# Patient Record
Sex: Female | Born: 1955
Health system: Southern US, Community
[De-identification: ages and names within clinical notes are randomized; demographics above are authoritative.]

## PROBLEM LIST (undated history)

## (undated) DIAGNOSIS — S86012A Strain of left Achilles tendon, initial encounter: Secondary | ICD-10-CM

## (undated) DIAGNOSIS — Z87442 Personal history of urinary calculi: Secondary | ICD-10-CM

## (undated) HISTORY — PX: COLONOSCOPY: SHX174

---

## 1998-05-11 ENCOUNTER — Other Ambulatory Visit: Admission: RE | Admit: 1998-05-11 | Discharge: 1998-05-11 | Payer: Self-pay | Admitting: Obstetrics and Gynecology

## 1999-09-21 ENCOUNTER — Other Ambulatory Visit: Admission: RE | Admit: 1999-09-21 | Discharge: 1999-09-21 | Payer: Self-pay | Admitting: Obstetrics and Gynecology

## 1999-09-21 ENCOUNTER — Encounter (INDEPENDENT_AMBULATORY_CARE_PROVIDER_SITE_OTHER): Payer: Self-pay

## 1999-11-28 ENCOUNTER — Other Ambulatory Visit: Admission: RE | Admit: 1999-11-28 | Discharge: 1999-11-28 | Payer: Self-pay | Admitting: Obstetrics and Gynecology

## 1999-11-28 ENCOUNTER — Encounter (INDEPENDENT_AMBULATORY_CARE_PROVIDER_SITE_OTHER): Payer: Self-pay

## 1999-12-06 ENCOUNTER — Other Ambulatory Visit: Admission: RE | Admit: 1999-12-06 | Discharge: 1999-12-06 | Payer: Self-pay | Admitting: Obstetrics and Gynecology

## 2001-02-16 ENCOUNTER — Other Ambulatory Visit: Admission: RE | Admit: 2001-02-16 | Discharge: 2001-02-16 | Payer: Self-pay | Admitting: Obstetrics and Gynecology

## 2002-11-15 ENCOUNTER — Other Ambulatory Visit: Admission: RE | Admit: 2002-11-15 | Discharge: 2002-11-15 | Payer: Self-pay | Admitting: Obstetrics and Gynecology

## 2004-06-05 ENCOUNTER — Other Ambulatory Visit: Admission: RE | Admit: 2004-06-05 | Discharge: 2004-06-05 | Payer: Self-pay | Admitting: Obstetrics and Gynecology

## 2005-08-19 ENCOUNTER — Other Ambulatory Visit: Admission: RE | Admit: 2005-08-19 | Discharge: 2005-08-19 | Payer: Self-pay | Admitting: *Deleted

## 2006-11-03 ENCOUNTER — Other Ambulatory Visit: Admission: RE | Admit: 2006-11-03 | Discharge: 2006-11-03 | Payer: Self-pay | Admitting: *Deleted

## 2007-12-16 ENCOUNTER — Other Ambulatory Visit: Admission: RE | Admit: 2007-12-16 | Discharge: 2007-12-16 | Payer: Self-pay | Admitting: Gynecology

## 2018-04-23 DIAGNOSIS — Z1231 Encounter for screening mammogram for malignant neoplasm of breast: Secondary | ICD-10-CM | POA: Diagnosis not present

## 2018-04-28 DIAGNOSIS — R922 Inconclusive mammogram: Secondary | ICD-10-CM | POA: Diagnosis not present

## 2018-05-20 DIAGNOSIS — H6123 Impacted cerumen, bilateral: Secondary | ICD-10-CM | POA: Diagnosis not present

## 2018-05-20 DIAGNOSIS — E7800 Pure hypercholesterolemia, unspecified: Secondary | ICD-10-CM | POA: Diagnosis not present

## 2018-05-20 DIAGNOSIS — Z23 Encounter for immunization: Secondary | ICD-10-CM | POA: Diagnosis not present

## 2018-05-20 DIAGNOSIS — Z0000 Encounter for general adult medical examination without abnormal findings: Secondary | ICD-10-CM | POA: Diagnosis not present

## 2018-05-20 DIAGNOSIS — E039 Hypothyroidism, unspecified: Secondary | ICD-10-CM | POA: Diagnosis not present

## 2018-06-15 DIAGNOSIS — D125 Benign neoplasm of sigmoid colon: Secondary | ICD-10-CM | POA: Diagnosis not present

## 2018-06-15 DIAGNOSIS — D120 Benign neoplasm of cecum: Secondary | ICD-10-CM | POA: Diagnosis not present

## 2018-06-15 DIAGNOSIS — K5730 Diverticulosis of large intestine without perforation or abscess without bleeding: Secondary | ICD-10-CM | POA: Diagnosis not present

## 2018-06-15 DIAGNOSIS — Z1211 Encounter for screening for malignant neoplasm of colon: Secondary | ICD-10-CM | POA: Diagnosis not present

## 2018-06-15 DIAGNOSIS — K635 Polyp of colon: Secondary | ICD-10-CM | POA: Diagnosis not present

## 2018-09-03 DIAGNOSIS — J101 Influenza due to other identified influenza virus with other respiratory manifestations: Secondary | ICD-10-CM | POA: Diagnosis not present

## 2019-10-18 ENCOUNTER — Ambulatory Visit: Payer: Self-pay

## 2020-02-09 ENCOUNTER — Emergency Department (HOSPITAL_BASED_OUTPATIENT_CLINIC_OR_DEPARTMENT_OTHER)
Admission: EM | Admit: 2020-02-09 | Discharge: 2020-02-09 | Disposition: A | Discharge status: Home / Self Care | Payer: Commercial Managed Care - PPO | Attending: Emergency Medicine | Admitting: Emergency Medicine

## 2020-02-09 ENCOUNTER — Other Ambulatory Visit: Payer: Self-pay

## 2020-02-09 ENCOUNTER — Emergency Department (HOSPITAL_BASED_OUTPATIENT_CLINIC_OR_DEPARTMENT_OTHER): Payer: Commercial Managed Care - PPO

## 2020-02-09 ENCOUNTER — Encounter (HOSPITAL_BASED_OUTPATIENT_CLINIC_OR_DEPARTMENT_OTHER): Payer: Self-pay | Admitting: Emergency Medicine

## 2020-02-09 DIAGNOSIS — N201 Calculus of ureter: Secondary | ICD-10-CM | POA: Diagnosis not present

## 2020-02-09 DIAGNOSIS — R350 Frequency of micturition: Secondary | ICD-10-CM | POA: Diagnosis not present

## 2020-02-09 DIAGNOSIS — R112 Nausea with vomiting, unspecified: Secondary | ICD-10-CM | POA: Insufficient documentation

## 2020-02-09 DIAGNOSIS — R109 Unspecified abdominal pain: Secondary | ICD-10-CM | POA: Diagnosis present

## 2020-02-09 LAB — CBC WITH DIFFERENTIAL/PLATELET
Abs Immature Granulocytes: 0.03 10*3/uL (ref 0.00–0.07)
Basophils Absolute: 0.1 10*3/uL (ref 0.0–0.1)
Basophils Relative: 1 %
Eosinophils Absolute: 0 10*3/uL (ref 0.0–0.5)
Eosinophils Relative: 0 %
HCT: 44.7 % (ref 36.0–46.0)
Hemoglobin: 14.9 g/dL (ref 12.0–15.0)
Immature Granulocytes: 0 %
Lymphocytes Relative: 15 %
Lymphs Abs: 1.4 10*3/uL (ref 0.7–4.0)
MCH: 30.3 pg (ref 26.0–34.0)
MCHC: 33.3 g/dL (ref 30.0–36.0)
MCV: 90.9 fL (ref 80.0–100.0)
Monocytes Absolute: 0.7 10*3/uL (ref 0.1–1.0)
Monocytes Relative: 7 %
Neutro Abs: 6.8 10*3/uL (ref 1.7–7.7)
Neutrophils Relative %: 77 %
Platelets: 249 10*3/uL (ref 150–400)
RBC: 4.92 MIL/uL (ref 3.87–5.11)
RDW: 11.9 % (ref 11.5–15.5)
WBC: 8.9 10*3/uL (ref 4.0–10.5)
nRBC: 0 % (ref 0.0–0.2)

## 2020-02-09 LAB — BASIC METABOLIC PANEL
Anion gap: 11 (ref 5–15)
BUN: 9 mg/dL (ref 8–23)
CO2: 27 mmol/L (ref 22–32)
Calcium: 9.7 mg/dL (ref 8.9–10.3)
Chloride: 101 mmol/L (ref 98–111)
Creatinine, Ser: 0.66 mg/dL (ref 0.44–1.00)
GFR calc Af Amer: >60 mL/min (ref >60)
GFR calc non Af Amer: >60 mL/min (ref >60)
Glucose, Bld: 114 mg/dL — ABNORMAL HIGH (ref 70–99)
Potassium: 4.3 mmol/L (ref 3.5–5.1)
Sodium: 139 mmol/L (ref 135–145)

## 2020-02-09 LAB — URINALYSIS, ROUTINE W REFLEX MICROSCOPIC
Bilirubin Urine: NEGATIVE
Glucose, UA: NEGATIVE mg/dL
Ketones, ur: NEGATIVE mg/dL
Nitrite: NEGATIVE
Protein, ur: NEGATIVE mg/dL
Specific Gravity, Urine: 1.01 (ref 1.005–1.030)
pH: 6.5 (ref 5.0–8.0)

## 2020-02-09 LAB — URINALYSIS, MICROSCOPIC (REFLEX)

## 2020-02-09 MED ORDER — TAMSULOSIN HCL 0.4 MG PO CAPS: 0.4000 mg | ORAL_CAPSULE | Freq: Every day | ORAL | 0 refills | Status: DC

## 2020-02-09 MED ORDER — KETOROLAC TROMETHAMINE 30 MG/ML IJ SOLN
30.0000 mg | Freq: Once | INTRAMUSCULAR | Status: AC
  Administered 2020-02-09: 30 mg via INTRAMUSCULAR
  Filled 2020-02-09: qty 1

## 2020-02-09 MED ORDER — ONDANSETRON 4 MG PO TBDP
4.0000 mg | ORAL_TABLET | Freq: Three times a day (TID) | ORAL | 0 refills | Status: DC | PRN
Start: 2020-02-09 — End: 2023-11-28

## 2020-02-09 MED ORDER — OXYCODONE-ACETAMINOPHEN 5-325 MG PO TABS: 1.0000 | ORAL_TABLET | Freq: Four times a day (QID) | ORAL | 0 refills | Status: DC | PRN

## 2020-02-09 NOTE — Discharge Instructions (Signed)
Please read instructions below. Drink plenty of water. You can take ibuprofen for mild to moderate pain. You can take Percocet every 6 hours as needed for severe pain. You can take Zofran every 6 hours as needed for nausea. Take flomax once per day for bladder spasm. Follow up with Urology, as it is likely you may need help with passing the stone. Return to the ER for fever, chills, uncontrollable vomiting, uncontrollable pain or worsening symptoms.

## 2020-02-09 NOTE — ED Notes (Signed)
Patient transported to CT 

## 2020-02-09 NOTE — ED Notes (Signed)
Pt with LLQ cramping, shooting pain that began this am that radiates to left flank. No hx of stones previously. Has noticed change in urine color, consistency. Denies discharge or bleeding.

## 2020-02-09 NOTE — ED Triage Notes (Signed)
LLQ abdominal pain since this am.  Pt has some dysuria.

## 2020-02-09 NOTE — ED Provider Notes (Signed)
[facility] EMERGENCY DEPARTMENT Provider Note   CSN: [id number] Arrival date & time: 02/09/20  1205     History Chief Complaint  Patient presents with  . Flank Pain  . Abdominal Pain    Suzanne Collins is a 64 y.o. female presenting to the emergency department with complaint of left lower quadrant abdominal pain that radiates to her left flank that began today.  She reports associated dark urine and some increased urinary frequency this evening.  She also reports nausea with an episode of nonbloody nonbilious emesis.  She denies dysuria, fevers, chills, diarrhea, pelvic complaints.   The history is provided by the patient.       No past medical history on file.  There are no problems to display for this patient.   History reviewed. No pertinent surgical history.   OB History   No obstetric history on file.     No family history on file.  Social History   Tobacco Use  . Smoking status: Never Smoker  . Smokeless tobacco: Never Used  Substance Use Topics  . Alcohol use: Not on file  . Drug use: Not on file    Home Medications Prior to Admission medications   Medication Sig Start Date End Date Taking? Authorizing Provider  ondansetron (ZOFRAN ODT) 4 MG disintegrating tablet Take 1 tablet (4 mg total) by mouth every 8 (eight) hours as needed for nausea or vomiting. 02/09/20   Kihanna Kamiya, [place] N, PA-C  oxyCODONE-acetaminophen (PERCOCET/ROXICET) 5-325 MG tablet Take 1 tablet by mouth every 6 (six) hours as needed for severe pain. 02/09/20   Josephus Harriger, [place] N, PA-C  tamsulosin (FLOMAX) 0.4 MG CAPS capsule Take 1 capsule (0.4 mg total) by mouth daily. 02/09/20   Jazper Nikolai, [place] N, PA-C    Allergies    Patient has no known allergies.  Review of Systems   Review of Systems  Gastrointestinal: Positive for abdominal pain, nausea and vomiting.  Genitourinary: Positive for flank pain and frequency.  All other systems reviewed and are negative.   Physical  Exam Updated Vital Signs BP (!) 161/110 (BP Location: Right Arm)   Pulse (!) 104   Temp 98.2 F (36.8 C) (Oral)   Resp 18   Ht 5\' 4"  (1.626 m)   Wt 83 kg   SpO2 99%   BMI 31.41 kg/m   Physical Exam Vitals and nursing note reviewed.  Constitutional:      Appearance: She is well-developed.     Comments: Patient is very well-appearing, no distress  HENT:     Head: Normocephalic and atraumatic.  Eyes:     Conjunctiva/sclera: Conjunctivae normal.  Cardiovascular:     Rate and Rhythm: Normal rate and regular rhythm.  Pulmonary:     Effort: Pulmonary effort is normal.     Breath sounds: Normal breath sounds.  Abdominal:     General: Bowel sounds are normal.     Palpations: Abdomen is soft.     Tenderness: There is abdominal tenderness in the left lower quadrant. There is left CVA tenderness. There is no guarding or rebound.  Skin:    General: Skin is warm.  Neurological:     Mental Status: She is alert.  Psychiatric:        Behavior: Behavior normal.     ED Results / Procedures / Treatments   Labs (all labs ordered are listed, but only abnormal results are displayed) Labs Reviewed  URINALYSIS, ROUTINE W REFLEX MICROSCOPIC - Abnormal; Notable for the following components:  Result Value   APPearance CLOUDY (*)    Hgb urine dipstick LARGE (*)    Leukocytes,Ua TRACE (*)    All other components within normal limits  URINALYSIS, MICROSCOPIC (REFLEX) - Abnormal; Notable for the following components:   Bacteria, UA FEW (*)    All other components within normal limits  BASIC METABOLIC PANEL - Abnormal; Notable for the following components:   Glucose, Bld 114 (*)    All other components within normal limits  CBC WITH DIFFERENTIAL/PLATELET    EKG None  Radiology CT Renal Stone Study  Result Date: 02/09/2020 CLINICAL DATA:  64 year old female with flank pain. Concern for kidney stone. EXAM: CT ABDOMEN AND PELVIS WITHOUT CONTRAST TECHNIQUE: Multidetector CT imaging of  the abdomen and pelvis was performed following the standard protocol without IV contrast. COMPARISON:  None. FINDINGS: Evaluation of this exam is limited in the absence of intravenous contrast. Lower chest: The visualized lung bases are clear. No intra-abdominal free air or free fluid. Hepatobiliary: Multiple hepatic hypodense lesions are suboptimally characterized on this noncontrast CT and measure up to approximately 3 cm in the dome of the liver. The larger lesions demonstrate fluid attenuation consistent with cysts. There is a 3 cm hypodense lesion in the posterior right lobe of the liver (19/2) with a focal area of peripheral calcification. This lesion demonstrates a slightly higher attenuation than simple fluid. Further characterization with MRI without and with contrast on a nonemergent/outpatient basis recommended. There is no intrahepatic biliary ductal dilatation. The gallbladder is unremarkable. Pancreas: Unremarkable. No pancreatic ductal dilatation or surrounding inflammatory changes. Spleen: Normal in size without focal abnormality. Adrenals/Urinary Tract: The adrenal glands unremarkable. There is a 1 cm stone in the proximal left ureter at the ureteropelvic junction with mild left hydronephrosis. The right kidney, right ureter, and urinary bladder appear unremarkable. Stomach/Bowel: There is sigmoid diverticulosis without active inflammatory changes. There is no bowel obstruction or active inflammation. The appendix is normal. Vascular/Lymphatic: The abdominal aorta and IVC unremarkable. No portal venous gas. There is no adenopathy. Reproductive: The uterus and ovaries are grossly unremarkable. No pelvic mass Other: None Musculoskeletal: Degenerative changes of spine. No acute osseous pathology. IMPRESSION: 1. A 1 cm proximal left ureteral stone with mild left hydronephrosis. 2. Sigmoid diverticulosis. No bowel obstruction. Normal appendix. 3. Indeterminate hepatic lesions. Further characterization  with MRI without and with contrast on a nonemergent/outpatient basis recommended. Electronically Signed   By: Anner Crete M.D.   On: 02/09/2020 15:23    Procedures Procedures (including critical care time)  Medications Ordered in ED Medications  ketorolac (TORADOL) 30 MG/ML injection 30 mg (has no administration in time range)    ED Course  I have reviewed the triage vital signs and the nursing notes.  Pertinent labs & imaging results that were available during my care of the patient were reviewed by me and considered in my medical decision making (see chart for details).  Clinical Course as of Feb 08 1841  Wed Feb 09, 2020  1734 LLQ pain radiating to back, no dysuria. Nausea and vomiting. Started today.   [JR]    Clinical Course User Index [JR] Lonna Rabold, [place] N, PA-C   MDM Rules/Calculators/A&P                          Patient presenting with left lower quadrant abdominal pain, left flank pain, nausea and vomiting urinary frequency that began today.  CT scan and UA obtained in triage reveal a  1 cm proximal left ureteral stone with mild hydronephrosis.  UA with hematuria, no infection.  On exam, she is very well-appearing and in no distress, tenderness to the left abdomen and flank.  She states symptoms significantly improved without intervention.  Metabolic panel obtained to evaluate for kidney function, and is normal.  Will treat with Toradol here, and prescribed medications should symptoms return.  Discussed likelihood of needing additional assistance from urology for passing stone due to size.  Also discussed incidental findings of liver lesions on CT scan and need for nonemergent outpatient MRI.  She is to follow-up with PCP for this incidental finding, patient verbalized understanding.  She is instructed of close follow-up and strict return precautions.  Urology notified of patient's referral and need for follow-up.  [place] Controlled Substance reporting System  queried  Discussed results, findings, treatment and follow up. Patient advised of return precautions. Patient verbalized understanding and agreed with plan.  Final Clinical Impression(s) / ED Diagnoses Final diagnoses:  Left ureteral stone    Rx / DC Orders ED Discharge Orders         Ordered    oxyCODONE-acetaminophen (PERCOCET/ROXICET) 5-325 MG tablet  Every 6 hours PRN     Discontinue  Reprint     02/09/20 1837    tamsulosin (FLOMAX) 0.4 MG CAPS capsule  Daily     Discontinue  Reprint     02/09/20 1837    ondansetron (ZOFRAN ODT) 4 MG disintegrating tablet  Every 8 hours PRN     Discontinue  Reprint     02/09/20 1837           Alanah Sakuma, [place] N, PA-C 02/09/20 1847    Virgel Manifold, MD 02/11/20 2307

## 2020-02-17 ENCOUNTER — Other Ambulatory Visit: Payer: Self-pay | Admitting: Urology

## 2020-02-24 ENCOUNTER — Other Ambulatory Visit (HOSPITAL_COMMUNITY)
Admission: RE | Admit: 2020-02-24 | Discharge: 2020-02-24 | Disposition: A | Discharge status: Home / Self Care | Payer: Commercial Managed Care - PPO | Source: Ambulatory Visit | Attending: Urology | Admitting: Urology

## 2020-02-24 DIAGNOSIS — Z01812 Encounter for preprocedural laboratory examination: Secondary | ICD-10-CM | POA: Insufficient documentation

## 2020-02-24 DIAGNOSIS — Z20822 Contact with and (suspected) exposure to covid-19: Secondary | ICD-10-CM | POA: Insufficient documentation

## 2020-02-24 LAB — SARS CORONAVIRUS 2 (TAT 6-24 HRS): SARS Coronavirus 2: NEGATIVE

## 2020-02-24 NOTE — Progress Notes (Signed)
Patient to arrive at 0600 on 02/28/2020. History and medications reviewed. All pre-procedure instructions given. NPO after MN. Driver secured.

## 2020-02-27 NOTE — H&P (Signed)
H&P  Chief Complaint: Kidney stone  History of Present Illness: Suzanne Collins is a 64 y.o. year old female presenting for ESL of a 10 mm left proximal ureteral stone. HU ~1000, SSD 13 cm.  No past medical history on file.  No past surgical history on file.  Home Medications:  No medications prior to admission.    Allergies: No Known Allergies  No family history on file.  Social History:  reports that she has never smoked. She has never used smokeless tobacco. No history on file for alcohol use and drug use.  ROS: A complete review of systems was performed.  All systems are negative except for pertinent findings as noted.  Physical Exam:  Vital signs in last 24 hours:   General:  Alert and oriented, No acute distress HEENT: Normocephalic, atraumatic Neck: No JVD or lymphadenopathy Cardiovascular: Regular rate  Lungs: Normal inspiratory/expiratory excursion Abdomen: Soft, nontender, nondistended, no abdominal masses Back: No CVA tenderness Extremities: No edema Neurologic: Grossly intact  Laboratory Data:  No results found for this or any previous visit (from the past 24 hour(s)). Recent Results (from the past 240 hour(s))  SARS CORONAVIRUS 2 (TAT 6-24 HRS) Nasopharyngeal Nasopharyngeal Swab     Status: None   Collection Time: 02/24/20  8:33 AM   Specimen: Nasopharyngeal Swab  Result Value Ref Range Status   SARS Coronavirus 2 NEGATIVE NEGATIVE Final    Comment: (NOTE) SARS-CoV-2 target nucleic acids are NOT DETECTED.  The SARS-CoV-2 RNA is generally detectable in upper and lower respiratory specimens during the acute phase of infection. Negative results do not preclude SARS-CoV-2 infection, do not rule out co-infections with other pathogens, and should not be used as the sole basis for treatment or other patient management decisions. Negative results must be combined with clinical observations, patient history, and epidemiological information. The expected result  is Negative.  Fact Sheet for Patients: SugarRoll.be  Fact Sheet for Healthcare Providers: https://www.woods-mathews.com/  This test is not yet approved or cleared by the [place] FDA and  has been authorized for detection and/or diagnosis of SARS-CoV-2 by FDA under an Emergency Use Authorization (EUA). This EUA will remain  in effect (meaning this test can be used) for the duration of the COVID-19 declaration under Se ction 564(b)(1) of the Act, 21 U.S.C. section 360bbb-3(b)(1), unless the authorization is terminated or revoked sooner.  Performed at [facility], [place] [address], [place], [place] [address]    Creatinine: No results for input(s): CREATININE in the last 168 hours.  Radiologic Imaging: No results found.  Impression/Assessment:  10 mm left proximal ureteral stoen  Plan:  ESL left proximal ureteral stone. I have discussed ESL w/ pt, probable outcome as well as potential staged nature of the procedure. She desires to proceed.  Suzanne Collins 02/27/2020, 7:02 PM  Suzanne Boxer. Jae Bruck MD

## 2020-02-28 ENCOUNTER — Encounter (HOSPITAL_BASED_OUTPATIENT_CLINIC_OR_DEPARTMENT_OTHER): Payer: Self-pay | Admitting: Urology

## 2020-02-28 ENCOUNTER — Ambulatory Visit (HOSPITAL_BASED_OUTPATIENT_CLINIC_OR_DEPARTMENT_OTHER)
Admission: RE | Admit: 2020-02-28 | Discharge: 2020-02-28 | Disposition: A | Discharge status: Home / Self Care | Payer: Commercial Managed Care - PPO | Attending: Urology | Admitting: Urology

## 2020-02-28 ENCOUNTER — Other Ambulatory Visit: Payer: Self-pay

## 2020-02-28 ENCOUNTER — Encounter (HOSPITAL_BASED_OUTPATIENT_CLINIC_OR_DEPARTMENT_OTHER)
Admission: RE | Disposition: A | Discharge status: Home / Self Care | Payer: Self-pay | Source: Home / Self Care | Attending: Urology

## 2020-02-28 ENCOUNTER — Ambulatory Visit (HOSPITAL_COMMUNITY): Payer: Commercial Managed Care - PPO

## 2020-02-28 DIAGNOSIS — Z79899 Other long term (current) drug therapy: Secondary | ICD-10-CM | POA: Diagnosis not present

## 2020-02-28 DIAGNOSIS — N201 Calculus of ureter: Secondary | ICD-10-CM

## 2020-02-28 HISTORY — PX: EXTRACORPOREAL SHOCK WAVE LITHOTRIPSY: SHX1557

## 2020-02-28 SURGERY — LITHOTRIPSY, ESWL
Anesthesia: LOCAL | Laterality: Left

## 2020-02-28 MED ORDER — DIPHENHYDRAMINE HCL 25 MG PO CAPS
ORAL_CAPSULE | ORAL | Status: AC
  Filled 2020-02-28: qty 1

## 2020-02-28 MED ORDER — OXYCODONE HCL 5 MG PO TABS: 5.0000 mg | ORAL_TABLET | Freq: Three times a day (TID) | ORAL | 0 refills | Status: AC | PRN

## 2020-02-28 MED ORDER — DIAZEPAM 5 MG PO TABS
10.0000 mg | ORAL_TABLET | ORAL | Status: AC
  Administered 2020-02-28: 10 mg via ORAL

## 2020-02-28 MED ORDER — SODIUM CHLORIDE 0.9 % IV SOLN: INTRAVENOUS | Status: DC

## 2020-02-28 MED ORDER — DIPHENHYDRAMINE HCL 25 MG PO CAPS
25.0000 mg | ORAL_CAPSULE | ORAL | Status: AC
  Administered 2020-02-28: 25 mg via ORAL

## 2020-02-28 MED ORDER — CIPROFLOXACIN HCL 500 MG PO TABS
500.0000 mg | ORAL_TABLET | ORAL | Status: AC
  Administered 2020-02-28: 500 mg via ORAL

## 2020-02-28 MED ORDER — DIAZEPAM 5 MG PO TABS
ORAL_TABLET | ORAL | Status: AC
  Filled 2020-02-28: qty 2

## 2020-02-28 MED ORDER — CIPROFLOXACIN HCL 500 MG PO TABS
ORAL_TABLET | ORAL | Status: AC
  Filled 2020-02-28: qty 1

## 2020-02-28 NOTE — Op Note (Signed)
See [facility] OP note scanned into chart. 

## 2020-02-28 NOTE — Discharge Instructions (Signed)
Lithotripsy, Care After This sheet gives you information about how to care for yourself after your procedure. Your health care provider may also give you more specific instructions. If you have problems or questions, contact your health care provider. What can I expect after the procedure? After the procedure, it is common to have:  Some blood in your urine. This should only last for a few days.  Soreness in your back, sides, or upper abdomen for a few days.  Blotches or bruises on your back where the pressure wave entered the skin.  Pain, discomfort, or nausea when pieces (fragments) of the kidney stone move through the tube that carries urine from the kidney to the bladder (ureter). Stone fragments may pass soon after the procedure, but they may continue to pass for up to 4-8 weeks. ? If you have severe pain or nausea, contact your health care provider. This may be caused by a large stone that was not broken up, and this may mean that you need more treatment.  Some pain or discomfort during urination.  Some pain or discomfort in the lower abdomen or (in men) at the base of the penis. Follow these instructions at home: Medicines  Take over-the-counter and prescription medicines only as told by your health care provider.  If you were prescribed an antibiotic medicine, take it as told by your health care provider. Do not stop taking the antibiotic even if you start to feel better.  Do not drive for 24 hours if you were given a medicine to help you relax (sedative).  Do not drive or use heavy machinery while taking prescription pain medicine. Eating and drinking      Drink enough water and fluids to keep your urine clear or pale yellow. This helps any remaining pieces of the stone to pass. It can also help prevent new stones from forming.  Eat plenty of fresh fruits and vegetables.  Follow instructions from your health care provider about eating and drinking restrictions. You may be  instructed: ? To reduce how much salt (sodium) you eat or drink. Check ingredients and nutrition facts on packaged foods and beverages. ? To reduce how much meat you eat.  Eat the recommended amount of calcium for your age and gender. Ask your health care provider how much calcium you should have. General instructions  Get plenty of rest.  Most people can resume normal activities 1-2 days after the procedure. Ask your health care provider what activities are safe for you.  Your health care provider may direct you to lie in a certain position (postural drainage) and tap firmly (percuss) over your kidney area to help stone fragments pass. Follow instructions as told by your health care provider.  If directed, strain all urine through the strainer that was provided by your health care provider. ? Keep all fragments for your health care provider to see. Any stones that are found may be sent to a medical lab for examination. The stone may be as small as a grain of salt.  Keep all follow-up visits as told by your health care provider. This is important. Contact a health care provider if:  You have pain that is severe or does not get better with medicine.  You have nausea that is severe or does not go away.  You have blood in your urine longer than your health care provider told you to expect.  You have more blood in your urine.  You have pain during urination that does   not go away.  You urinate more frequently than usual and this does not go away.  You develop a rash or any other possible signs of an allergic reaction. Get help right away if:  You have severe pain in your back, sides, or upper abdomen.  You have severe pain while urinating.  Your urine is very dark red.  You have blood in your stool (feces).  You cannot pass any urine at all.  You feel a strong urge to urinate after emptying your bladder.  You have a fever or chills.  You develop shortness of breath,  difficulty breathing, or chest pain.  You have severe nausea that leads to persistent vomiting.  You faint. Summary  After this procedure, it is common to have some pain, discomfort, or nausea when pieces (fragments) of the kidney stone move through the tube that carries urine from the kidney to the bladder (ureter). If this pain or nausea is severe, however, you should contact your health care provider.  Most people can resume normal activities 1-2 days after the procedure. Ask your health care provider what activities are safe for you.  Drink enough water and fluids to keep your urine clear or pale yellow. This helps any remaining pieces of the stone to pass, and it can help prevent new stones from forming.  If directed, strain your urine and keep all fragments for your health care provider to see. Fragments or stones may be as small as a grain of salt.  Get help right away if you have severe pain in your back, sides, or upper abdomen or have severe pain while urinating. This information is not intended to replace advice given to you by your health care provider. Make sure you discuss any questions you have with your health care provider. Document Revised: 10/05/2018 Document Reviewed: 05/15/2016 Elsevier Patient Education  [place] Instructions  Activity: Get plenty of rest for the remainder of the day. A responsible individual must stay with you for 24 hours following the procedure.  For the next 24 hours, DO NOT: -Drive a car -Paediatric nurse -Drink alcoholic beverages -Take any medication unless instructed by your physician -Make any legal decisions or sign important papers.  Meals: Start with liquid foods such as gelatin or soup. Progress to regular foods as tolerated. Avoid greasy, spicy, heavy foods. If nausea and/or vomiting occur, drink only clear liquids until the nausea and/or vomiting subsides. Call your physician if vomiting  continues.

## 2020-02-28 NOTE — Interval H&P Note (Signed)
History and Physical Interval Note:  02/28/2020 9:07 AM  Suzanne Collins  has presented today for surgery, with the diagnosis of LEFT PROXIMAL STONE.  The various methods of treatment have been discussed with the patient and family. After consideration of risks, benefits and other options for treatment, the patient has consented to  Procedure(s): LEFT EXTRACORPOREAL SHOCK WAVE LITHOTRIPSY (ESWL) (Left) as a surgical intervention.  The patient's history has been reviewed, patient examined, no change in status, stable for surgery.  I have reviewed the patient's chart and labs.  Questions were answered to the patient's satisfaction.     Lillette Boxer Amoreena Neubert

## 2020-02-29 ENCOUNTER — Encounter (HOSPITAL_BASED_OUTPATIENT_CLINIC_OR_DEPARTMENT_OTHER): Payer: Self-pay | Admitting: Urology

## 2020-03-01 ENCOUNTER — Telehealth: Payer: Self-pay | Admitting: Urology

## 2020-03-01 NOTE — Telephone Encounter (Signed)
Returned call from patient. She reports urinary urgency after recent ESWL treatment. She denies fevers/chills, dysuria, and gross hematuria. She feels that she is emptying her bladder completely when she urinates but then has to go to the bathroom again shortly after. She passed a stone fragment earlier today.  I discussed that urinary urgency is not uncommon after ESWL and recommended that she drink lots of water and continue to take her flomax as prescribed. I discussed that I can send in an Rx for oxybutynin 5mg  TID PRN for bladder spasms; however, she preferred not to try this medication after I reviewed the possible side effects.   She will monitor her symptoms tonight and call our clinic tomorrow if her symptoms worsen and she wants to try oxybutynin.   I discussed that she should go to the ED for inability urinate and/or fevers/chills.

## 2020-07-10 ENCOUNTER — Inpatient Hospital Stay (HOSPITAL_COMMUNITY): Payer: Commercial Managed Care - PPO

## 2020-07-10 ENCOUNTER — Inpatient Hospital Stay (HOSPITAL_COMMUNITY)
Admission: EM | Admit: 2020-07-10 | Discharge: 2020-07-13 | DRG: 177 | Disposition: A | Discharge status: Home / Self Care | Payer: Commercial Managed Care - PPO | Attending: Internal Medicine | Admitting: Internal Medicine

## 2020-07-10 ENCOUNTER — Encounter (HOSPITAL_COMMUNITY): Payer: Self-pay

## 2020-07-10 ENCOUNTER — Other Ambulatory Visit: Payer: Self-pay

## 2020-07-10 ENCOUNTER — Emergency Department (HOSPITAL_COMMUNITY): Payer: Commercial Managed Care - PPO

## 2020-07-10 DIAGNOSIS — R7989 Other specified abnormal findings of blood chemistry: Secondary | ICD-10-CM | POA: Diagnosis not present

## 2020-07-10 DIAGNOSIS — E669 Obesity, unspecified: Secondary | ICD-10-CM | POA: Diagnosis present

## 2020-07-10 DIAGNOSIS — Z6830 Body mass index (BMI) 30.0-30.9, adult: Secondary | ICD-10-CM

## 2020-07-10 DIAGNOSIS — D6859 Other primary thrombophilia: Secondary | ICD-10-CM | POA: Diagnosis present

## 2020-07-10 DIAGNOSIS — R7982 Elevated C-reactive protein (CRP): Secondary | ICD-10-CM | POA: Diagnosis present

## 2020-07-10 DIAGNOSIS — U071 COVID-19: Principal | ICD-10-CM | POA: Diagnosis present

## 2020-07-10 DIAGNOSIS — I700 Atherosclerosis of aorta: Secondary | ICD-10-CM

## 2020-07-10 DIAGNOSIS — J1282 Pneumonia due to coronavirus disease 2019: Secondary | ICD-10-CM | POA: Diagnosis present

## 2020-07-10 DIAGNOSIS — Z66 Do not resuscitate: Secondary | ICD-10-CM | POA: Diagnosis present

## 2020-07-10 DIAGNOSIS — F419 Anxiety disorder, unspecified: Secondary | ICD-10-CM | POA: Diagnosis present

## 2020-07-10 DIAGNOSIS — F418 Other specified anxiety disorders: Secondary | ICD-10-CM | POA: Diagnosis not present

## 2020-07-10 DIAGNOSIS — J9601 Acute respiratory failure with hypoxia: Secondary | ICD-10-CM | POA: Diagnosis present

## 2020-07-10 DIAGNOSIS — R4589 Other symptoms and signs involving emotional state: Secondary | ICD-10-CM

## 2020-07-10 LAB — COMPREHENSIVE METABOLIC PANEL
ALT: 23 U/L (ref 0–44)
AST: 26 U/L (ref 15–41)
Albumin: 3.6 g/dL (ref 3.5–5.0)
Alkaline Phosphatase: 52 U/L (ref 38–126)
Anion gap: 13 (ref 5–15)
BUN: 9 mg/dL (ref 8–23)
CO2: 26 mmol/L (ref 22–32)
Calcium: 8.8 mg/dL — ABNORMAL LOW (ref 8.9–10.3)
Chloride: 101 mmol/L (ref 98–111)
Creatinine, Ser: 0.66 mg/dL (ref 0.44–1.00)
GFR, Estimated: >60 mL/min (ref >60)
Glucose, Bld: 99 mg/dL (ref 70–99)
Potassium: 2.8 mmol/L — ABNORMAL LOW (ref 3.5–5.1)
Sodium: 140 mmol/L (ref 135–145)
Total Bilirubin: 0.8 mg/dL (ref 0.3–1.2)
Total Protein: 7.4 g/dL (ref 6.5–8.1)

## 2020-07-10 LAB — RESP PANEL BY RT-PCR (FLU A&B, COVID) ARPGX2
Influenza A by PCR: NEGATIVE
Influenza B by PCR: NEGATIVE
SARS Coronavirus 2 by RT PCR: POSITIVE — AB

## 2020-07-10 LAB — CBC WITH DIFFERENTIAL/PLATELET
Abs Immature Granulocytes: 0.07 10*3/uL (ref 0.00–0.07)
Basophils Absolute: 0 10*3/uL (ref 0.0–0.1)
Basophils Relative: 0 %
Eosinophils Absolute: 0 10*3/uL (ref 0.0–0.5)
Eosinophils Relative: 0 %
HCT: 40.4 % (ref 36.0–46.0)
Hemoglobin: 13.5 g/dL (ref 12.0–15.0)
Immature Granulocytes: 1 %
Lymphocytes Relative: 18 %
Lymphs Abs: 1.1 10*3/uL (ref 0.7–4.0)
MCH: 30.1 pg (ref 26.0–34.0)
MCHC: 33.4 g/dL (ref 30.0–36.0)
MCV: 90.2 fL (ref 80.0–100.0)
Monocytes Absolute: 0.6 10*3/uL (ref 0.1–1.0)
Monocytes Relative: 10 %
Neutro Abs: 4.3 10*3/uL (ref 1.7–7.7)
Neutrophils Relative %: 71 %
Platelets: 212 10*3/uL (ref 150–400)
RBC: 4.48 MIL/uL (ref 3.87–5.11)
RDW: 12.2 % (ref 11.5–15.5)
WBC: 6 10*3/uL (ref 4.0–10.5)
nRBC: 0 % (ref 0.0–0.2)

## 2020-07-10 LAB — TRIGLYCERIDES: Triglycerides: 109 mg/dL (ref <150)

## 2020-07-10 LAB — D-DIMER, QUANTITATIVE: D-Dimer, Quant: >20 ug/mL-FEU — ABNORMAL HIGH (ref 0.00–0.50)

## 2020-07-10 LAB — PROCALCITONIN: Procalcitonin: <0.1 ng/mL

## 2020-07-10 LAB — LACTIC ACID, PLASMA: Lactic Acid, Venous: 1 mmol/L (ref 0.5–1.9)

## 2020-07-10 LAB — FIBRINOGEN: Fibrinogen: 669 mg/dL — ABNORMAL HIGH (ref 210–475)

## 2020-07-10 LAB — MAGNESIUM: Magnesium: 2.2 mg/dL (ref 1.7–2.4)

## 2020-07-10 LAB — LIPID PANEL
Cholesterol: 144 mg/dL (ref 0–200)
HDL: 62 mg/dL (ref >40)
LDL Cholesterol: 60 mg/dL (ref 0–99)
Total CHOL/HDL Ratio: 2.3 RATIO
Triglycerides: 108 mg/dL (ref <150)
VLDL: 22 mg/dL (ref 0–40)

## 2020-07-10 LAB — HIV ANTIBODY (ROUTINE TESTING W REFLEX): HIV Screen 4th Generation wRfx: NONREACTIVE

## 2020-07-10 LAB — FERRITIN: Ferritin: 452 ng/mL — ABNORMAL HIGH (ref 11–307)

## 2020-07-10 LAB — HEPARIN LEVEL (UNFRACTIONATED): Heparin Unfractionated: 0.21 IU/mL — ABNORMAL LOW (ref 0.30–0.70)

## 2020-07-10 LAB — LACTATE DEHYDROGENASE: LDH: 212 U/L — ABNORMAL HIGH (ref 98–192)

## 2020-07-10 LAB — C-REACTIVE PROTEIN: CRP: 10.5 mg/dL — ABNORMAL HIGH (ref <1.0)

## 2020-07-10 MED ORDER — HEPARIN (PORCINE) 25000 UT/250ML-% IV SOLN
1350.0000 [IU]/h | INTRAVENOUS | Status: DC
  Administered 2020-07-10: 1350 [IU]/h via INTRAVENOUS
  Filled 2020-07-10: qty 250

## 2020-07-10 MED ORDER — BARICITINIB 2 MG PO TABS
4.0000 mg | ORAL_TABLET | Freq: Every day | ORAL | Status: DC
  Administered 2020-07-11 – 2020-07-13 (×3): 4 mg via ORAL
  Filled 2020-07-10 (×3): qty 2

## 2020-07-10 MED ORDER — LORAZEPAM 2 MG/ML IJ SOLN: 0.5000 mg | Freq: Four times a day (QID) | INTRAMUSCULAR | Status: DC | PRN

## 2020-07-10 MED ORDER — IPRATROPIUM-ALBUTEROL 20-100 MCG/ACT IN AERS: 1.0000 | INHALATION_SPRAY | Freq: Four times a day (QID) | RESPIRATORY_TRACT | Status: DC | PRN

## 2020-07-10 MED ORDER — DEXAMETHASONE SODIUM PHOSPHATE 10 MG/ML IJ SOLN
6.0000 mg | Freq: Once | INTRAMUSCULAR | Status: AC
  Administered 2020-07-10: 6 mg via INTRAVENOUS
  Filled 2020-07-10: qty 1

## 2020-07-10 MED ORDER — SODIUM CHLORIDE 0.9% FLUSH
3.0000 mL | Freq: Two times a day (BID) | INTRAVENOUS | Status: DC
  Administered 2020-07-11 – 2020-07-13 (×5): 3 mL via INTRAVENOUS

## 2020-07-10 MED ORDER — HEPARIN (PORCINE) 25000 UT/250ML-% IV SOLN
1200.0000 [IU]/h | INTRAVENOUS | Status: DC
  Administered 2020-07-10: 1200 [IU]/h via INTRAVENOUS
  Filled 2020-07-10: qty 250

## 2020-07-10 MED ORDER — SODIUM CHLORIDE 0.9 % IV SOLN
200.0000 mg | Freq: Once | INTRAVENOUS | Status: AC
  Administered 2020-07-10: 200 mg via INTRAVENOUS
  Filled 2020-07-10: qty 200

## 2020-07-10 MED ORDER — POTASSIUM CHLORIDE 10 MEQ/100ML IV SOLN
10.0000 meq | Freq: Once | INTRAVENOUS | Status: AC
  Administered 2020-07-10: 10 meq via INTRAVENOUS
  Filled 2020-07-10: qty 100

## 2020-07-10 MED ORDER — ONDANSETRON HCL 4 MG/2ML IJ SOLN: 4.0000 mg | Freq: Four times a day (QID) | INTRAMUSCULAR | Status: DC | PRN

## 2020-07-10 MED ORDER — POTASSIUM CHLORIDE CRYS ER 20 MEQ PO TBCR
40.0000 meq | EXTENDED_RELEASE_TABLET | Freq: Once | ORAL | Status: DC
  Filled 2020-07-10: qty 2

## 2020-07-10 MED ORDER — PREDNISONE 50 MG PO TABS: 50.0000 mg | ORAL_TABLET | Freq: Every day | ORAL | Status: DC

## 2020-07-10 MED ORDER — SODIUM CHLORIDE 0.9 % IV SOLN
100.0000 mg | Freq: Every day | INTRAVENOUS | Status: DC
  Administered 2020-07-11 – 2020-07-13 (×3): 100 mg via INTRAVENOUS
  Filled 2020-07-10 (×4): qty 20

## 2020-07-10 MED ORDER — ONDANSETRON HCL 4 MG PO TABS: 4.0000 mg | ORAL_TABLET | Freq: Four times a day (QID) | ORAL | Status: DC | PRN

## 2020-07-10 MED ORDER — IOHEXOL 350 MG/ML SOLN
100.0000 mL | Freq: Once | INTRAVENOUS | Status: AC | PRN
  Administered 2020-07-10: 69 mL via INTRAVENOUS

## 2020-07-10 MED ORDER — METHYLPREDNISOLONE SODIUM SUCC 125 MG IJ SOLR
0.5000 mg/kg | Freq: Two times a day (BID) | INTRAMUSCULAR | Status: DC
  Filled 2020-07-10: qty 2

## 2020-07-10 MED ORDER — HEPARIN BOLUS VIA INFUSION
1000.0000 [IU] | Freq: Once | INTRAVENOUS | Status: AC
  Administered 2020-07-10: 1000 [IU] via INTRAVENOUS
  Filled 2020-07-10: qty 1000

## 2020-07-10 MED ORDER — ACETAMINOPHEN 325 MG PO TABS: 650.0000 mg | ORAL_TABLET | Freq: Four times a day (QID) | ORAL | Status: DC | PRN

## 2020-07-10 MED ORDER — GUAIFENESIN-DM 100-10 MG/5ML PO SYRP: 10.0000 mL | ORAL_SOLUTION | ORAL | Status: DC | PRN

## 2020-07-10 MED ORDER — HEPARIN BOLUS VIA INFUSION
2000.0000 [IU] | Freq: Once | INTRAVENOUS | Status: DC
  Administered 2020-07-10: 2000 [IU] via INTRAVENOUS
  Filled 2020-07-10: qty 2000

## 2020-07-10 NOTE — ED Triage Notes (Addendum)
Pt presents with c/o covid exposure. Pt's husband tested positive for Covid on Christmas Day. Pt reports she has tested negative but she has lost her taste and smell and now has a cough. Pt initially 91% on RA while talking but once she stopped talking, she was 94%.

## 2020-07-10 NOTE — ED Notes (Signed)
Pt O2 dropped to 89% RA. Pt now 96% on 2L [place].

## 2020-07-10 NOTE — Progress Notes (Addendum)
ANTICOAGULATION CONSULT NOTE - Initial Consult  Pharmacy Consult for Heparin Indication: R/O pulmonary embolus  No Known Allergies  Patient Measurements:   Heparin Dosing Weight: 65kg  Vital Signs: Temp: 98.6 F (37 C) (01/03 1133) Temp Source: Oral (01/03 1133) BP: 125/85 (01/03 1305) Pulse Rate: 72 (01/03 1305)  Labs: Recent Labs    07/10/20 1206  HGB 13.5  HCT 40.4  PLT 212  CREATININE 0.66    CrCl cannot be calculated (Unknown ideal weight.).   Medical History: History reviewed. No pertinent past medical history.  Medications:  Infusions:  . potassium chloride      Assessment: 65 yo F who is COVID+ presents with worsening shortness of breath. DDimer>20, CT chest pending to rule out PE. Pharmacy has been consulted to start IV heparin empirically. She is not on anticoagulants PTA.   Baseline CBC WNL.  Goal of Therapy:  Heparin level 0.3-0.7 units/ml Monitor platelets by anticoagulation protocol: Yes   Plan:  Give 2000 units bolus x 1 Start heparin infusion at 1200 units/hr Check anti-Xa level in 6 hours and daily while on heparin Continue to monitor H&H and platelets  F/U Chest CT to confirm PE diagnosis  Junita Push PharmD, BCPS 07/10/2020,1:36 PM

## 2020-07-10 NOTE — ED Notes (Addendum)
Patient was given water and some crackers.

## 2020-07-10 NOTE — ED Provider Notes (Signed)
[facility]-EMERGENCY DEPT Provider Note   CSN: [id number] Arrival date & time: 07/10/20  0901     History Chief Complaint  Patient presents with  . Covid Exposure    Suzanne Collins is a 65 y.o. female.  65 year old female with prior medical history as detailed below presents for evaluation of possible Covid infection.  Patient reports that her husband has been diagnosed with Covid.  He tested positive on Christmas Day.  She began to have symptoms on the day after Christmas.  She reports that she had a negative test at home.  However, despite her negative test she is convinced that she has Covid.  She reports recent use of ivermectin, Z-Pak, and prednisone for treatment.  She complains of worsening shortness of breath especially with exertion over the last 48 hours.  She denies recent fever.  She reports receipt of 2 doses of Pfizer vaccine.  The history is provided by the patient.  Illness Location:  Cough, shortness of breath, suspected Covid Severity:  Moderate Onset quality:  Gradual Duration:  8 days Timing:  Constant Progression:  Worsening Chronicity:  New Associated symptoms: shortness of breath        History reviewed. No pertinent past medical history.  There are no problems to display for this patient.   Past Surgical History:  Procedure Laterality Date  . COLONOSCOPY    . EXTRACORPOREAL SHOCK WAVE LITHOTRIPSY Left 02/28/2020   Procedure: LEFT EXTRACORPOREAL SHOCK WAVE LITHOTRIPSY (ESWL);  Surgeon: Marcine Matar, MD;  Location: [facility];  Service: Urology;  Laterality: Left;     OB History   No obstetric history on file.     History reviewed. No pertinent family history.  Social History   Tobacco Use  . Smoking status: Never Smoker  . Smokeless tobacco: Never Used    Home Medications Prior to Admission medications   Medication Sig Start Date End Date Taking? Authorizing Provider  acetaminophen (TYLENOL) 500  MG tablet Take 500 mg by mouth every 6 (six) hours as needed.    [provider]  ondansetron (ZOFRAN ODT) 4 MG disintegrating tablet Take 1 tablet (4 mg total) by mouth every 8 (eight) hours as needed for nausea or vomiting. 02/09/20   Robinson, [place] N, PA-C  oxyCODONE (ROXICODONE) 5 MG immediate release tablet Take 1 tablet (5 mg total) by mouth every 8 (eight) hours as needed. 02/28/20 02/27/21  Marcine Matar, MD  oxyCODONE-acetaminophen (PERCOCET/ROXICET) 5-325 MG tablet Take 1 tablet by mouth every 6 (six) hours as needed for severe pain. 02/09/20   Robinson, [place] N, PA-C  tamsulosin (FLOMAX) 0.4 MG CAPS capsule Take 1 capsule (0.4 mg total) by mouth daily. 02/09/20   Robinson, [place] N, PA-C    Allergies    Patient has no known allergies.  Review of Systems   Review of Systems  Respiratory: Positive for shortness of breath.   All other systems reviewed and are negative.   Physical Exam Updated Vital Signs BP 139/86   Pulse 68   Temp 98.6 F (37 C) (Oral)   Resp 15   SpO2 98%   Physical Exam Vitals and nursing note reviewed.  Constitutional:      General: She is not in acute distress.    Appearance: Normal appearance. She is well-developed and well-nourished.  HENT:     Head: Normocephalic and atraumatic.     Mouth/Throat:     Mouth: Oropharynx is clear and moist.  Eyes:     Extraocular  Movements: EOM normal.     Conjunctiva/sclera: Conjunctivae normal.     Pupils: Pupils are equal, round, and reactive to light.  Cardiovascular:     Rate and Rhythm: Normal rate and regular rhythm.     Heart sounds: Normal heart sounds.  Pulmonary:     Effort: Pulmonary effort is normal. No respiratory distress.     Breath sounds: Normal breath sounds.  Abdominal:     General: There is no distension.     Palpations: Abdomen is soft.     Tenderness: There is no abdominal tenderness.  Musculoskeletal:        General: No deformity or edema. Normal range of motion.      Cervical back: Normal range of motion and neck supple.  Skin:    General: Skin is warm and dry.  Neurological:     Mental Status: She is alert and oriented to person, place, and time.  Psychiatric:        Mood and Affect: Mood and affect normal.     ED Results / Procedures / Treatments   Labs (all labs ordered are listed, but only abnormal results are displayed) Labs Reviewed  RESP PANEL BY RT-PCR (FLU A&B, COVID) ARPGX2 - Abnormal; Notable for the following components:      Result Value   SARS Coronavirus 2 by RT PCR POSITIVE (*)    All other components within normal limits  COMPREHENSIVE METABOLIC PANEL - Abnormal; Notable for the following components:   Potassium 2.8 (*)    Calcium 8.8 (*)    All other components within normal limits  D-DIMER, QUANTITATIVE (NOT [facility]) - Abnormal; Notable for the following components:   D-Dimer, Quant >20.00 (*)    All other components within normal limits  LACTATE DEHYDROGENASE - Abnormal; Notable for the following components:   LDH 212 (*)    All other components within normal limits  FERRITIN - Abnormal; Notable for the following components:   Ferritin 452 (*)    All other components within normal limits  FIBRINOGEN - Abnormal; Notable for the following components:   Fibrinogen 669 (*)    All other components within normal limits  C-REACTIVE PROTEIN - Abnormal; Notable for the following components:   CRP 10.5 (*)    All other components within normal limits  CULTURE, BLOOD (ROUTINE X 2)  CULTURE, BLOOD (ROUTINE X 2)  LACTIC ACID, PLASMA  CBC WITH DIFFERENTIAL/PLATELET  TRIGLYCERIDES  LACTIC ACID, PLASMA  PROCALCITONIN    EKG EKG Interpretation  Date/Time:  Monday July 10 2020 12:23:39 EST Ventricular Rate:  75 PR Interval:    QRS Duration: 81 QT Interval:  372 QTC Calculation: 416 R Axis:   36 Text Interpretation: Age not entered, assumed to be  65 years old for purpose of ECG interpretation Sinus rhythm Atrial  premature complex Low voltage, precordial leads Borderline ST depression, inferior leads Confirmed by Kristine RoyalMessick, Nomar Broad [phone]([id number]) on 07/10/2020 12:33:18 PM   Radiology DG Chest Port 1 View  Result Date: 07/10/2020 CLINICAL DATA:  dyspnea EXAM: PORTABLE CHEST 1 VIEW COMPARISON:  04/11/2016. FINDINGS: No pneumothorax or pleural effusion. Mild hypoinflation. Patchy bilateral mid to lower lung opacities. Stable cardiomediastinal silhouette. No acute osseous abnormality. IMPRESSION: Patchy bilateral pulmonary opacities concerning for infection. Electronically Signed   By: Stana Buntinghikanele  Emekauwa M.D.   On: 07/10/2020 11:54    Procedures Procedures (including critical care time) CRITICAL CARE Performed by: Wynetta FinesPeter C Keevin Panebianco   Total critical care time: 30 minutes  Critical care time  was exclusive of separately billable procedures and treating other patients.  Critical care was necessary to treat or prevent imminent or life-threatening deterioration.  Critical care was time spent personally by me on the following activities: development of treatment plan with patient and/or surrogate as well as nursing, discussions with consultants, evaluation of patient's response to treatment, examination of patient, obtaining history from patient or surrogate, ordering and performing treatments and interventions, ordering and review of laboratory studies, ordering and review of radiographic studies, pulse oximetry and re-evaluation of patient's condition.   Medications Ordered in ED Medications - No data to display  ED Course  I have reviewed the triage vital signs and the nursing notes.  Pertinent labs & imaging results that were available during my care of the patient were reviewed by me and considered in my medical decision making (see chart for details).    MDM Rules/Calculators/A&P                          MDM  Screen complete  Margarit Glade was evaluated in Emergency Department on 07/10/2020 for the symptoms  described in the history of present illness. She was evaluated in the context of the global COVID-19 pandemic, which necessitated consideration that the patient might be at risk for infection with the SARS-CoV-2 virus that causes COVID-19. Institutional protocols and algorithms that pertain to the evaluation of patients at risk for COVID-19 are in a state of rapid change based on information released by regulatory bodies including the CDC and federal and state organizations. These policies and algorithms were followed during the patient's care in the ED.  Patient is presenting with complaint of likely Covid infection.    She is dyspneic and mildly hypoxic with RA Sats dropping to 89%.   She is comfortable at rest on 2L [place] with Sats around 97%.   Patient would benefit from admission.   Hospitalist service is aware of case and will evaluate for admission.   Final Clinical Impression(s) / ED Diagnoses Final diagnoses:  [id number]    Rx / DC Orders ED Discharge Orders    None       Valarie Merino, MD 07/10/20 1338

## 2020-07-10 NOTE — Progress Notes (Addendum)
ANTICOAGULATION CONSULT NOTE   Pharmacy Consult for Heparin Indication: R/O pulmonary embolus  No Known Allergies  Patient Measurements: Height: 5\' 4"  (162.6 cm) Weight: 80.3 kg (177 lb) IBW/kg (Calculated) : 54.7 Heparin Dosing Weight: 65kg  Vital Signs: Temp: 98.6 F (37 C) (01/03 1133) Temp Source: Oral (01/03 1133) BP: 130/93 (01/03 2130) Pulse Rate: 75 (01/03 2130)  Labs: Recent Labs    07/10/20 1206 07/10/20 2100  HGB 13.5  --   HCT 40.4  --   PLT 212  --   HEPARINUNFRC  --  0.21*  CREATININE 0.66  --     Estimated Creatinine Clearance: 72.8 mL/min (by C-G formula based on SCr of 0.66 mg/dL).   Medical History: History reviewed. No pertinent past medical history.  Medications:  Infusions:  . heparin    . [START ON 07/11/2020] remdesivir 100 mg in NS 100 mL      Assessment: 65 yo F who is COVID+ presents with worsening shortness of breath. DDimer>20, CT chest pending to rule out PE. Pharmacy has been consulted to start IV heparin empirically. She is not on anticoagulants PTA.   Baseline CBC WNL.  2nd shift update:  Heparin level = 0.21 (subtherapeutic) on heparin gtt @ 1200 units/hr  No complications of therapy or line issues per RN  Dopplers : - DVT  CTAngio: - PE  With DDimer>20 Dr 77 wants to continue IV heparin for now  Goal of Therapy:  Heparin level 0.3-0.7 units/ml Monitor platelets by anticoagulation protocol: Yes   Plan:   Rebolus heparin 1000 units IV x 1 and increase heparin gtt to 1350 units/hr  Check heparin level 6 hr after rate increase  Follow daily heparin level & CBC while on heparin gtt  F/U anticoagulation plans   Meira Wahba, Dairl Ponder PharmD 07/10/2020,10:33 PM  ADDENDUM:   Heparin level = 0.58 (therapeutic) following heparin bolus and gtt rate increase to 1350 units/hr  CBC WNL  No complications of therapy noted  PLAN: - continue IV heparin gtt @ 1350 units/hr - check heparin level in 6 hr to  confirm therapeutic dose  09/07/2020, PharmD 07/12/19 @ 04:51

## 2020-07-10 NOTE — ED Notes (Signed)
Hospitalist bedside 

## 2020-07-10 NOTE — H&P (Signed)
History and Physical        Hospital Admission Note Date: 07/10/2020  Patient name: Suzanne Collins Medical record number: [id number] Date of birth: 10-27-1955 Age: 65 y.o. Gender: female  PCP: Ramiro Harvest, PA-C   Chief Complaint    Chief Complaint  Patient presents with  . Covid Exposure      HPI:   This is a 65 year old female with no significant past medical history who has been vaccinated against COVID-19 with 2 doses of the Pfizer vaccine who has been having worsening shortness of breath for the past 48 hours.  Patient's husband was also admitted today for COVID-19 who tested positive on Christmas Day.  Patient had a negative test at home however she did have a change in taste and smell and now has a cough and so despite the negative test she was convinced that she had Covid.  She also reports recent use of ivermectin, Z-Pak and prednisone.  States that she did have some bilateral lower extremity discomfort the other day which improved after going in the bath.  Today she did have some right-sided chest pain with deep inspiration.   ED Course: Afebrile, hypoxic (SpO2 Per ED note 89% on room air) placed on 2 L/min. Notable Labs: Sodium 140, K2.8, BUN 9, creatinine 0.66, LDH 212, triglycerides 109, lactic acid 1.0, WBC 6.0, Hb 13.5, platelets 212, D-dimer >20, fibrinogen 669, COVID-19 positive. Notable Imaging: CXR-patchy bilateral pulmonary opacities concerning for infection. Patient received dexamethasone.    Vitals:   07/10/20 1305 07/10/20 1430  BP: 125/85 135/69  Pulse: 72 67  Resp: (!) 21 15  Temp:    SpO2: 98% 95%     Review of Systems:  Review of Systems  All other systems reviewed and are negative.   Medical/Social/Family History   Past Medical History: History reviewed. No pertinent past medical history.  Past Surgical History:  Procedure Laterality  Date  . COLONOSCOPY    . EXTRACORPOREAL SHOCK WAVE LITHOTRIPSY Left 02/28/2020   Procedure: LEFT EXTRACORPOREAL SHOCK WAVE LITHOTRIPSY (ESWL);  Surgeon: Franchot Gallo, MD;  Location: [facility];  Service: Urology;  Laterality: Left;    Medications: Prior to Admission medications   Medication Sig Start Date End Date Taking? Authorizing Provider  acetaminophen (TYLENOL) 500 MG tablet Take 500 mg by mouth every 6 (six) hours as needed.    [provider]  ondansetron (ZOFRAN ODT) 4 MG disintegrating tablet Take 1 tablet (4 mg total) by mouth every 8 (eight) hours as needed for nausea or vomiting. 02/09/20   Robinson, [place] N, PA-C  oxyCODONE (ROXICODONE) 5 MG immediate release tablet Take 1 tablet (5 mg total) by mouth every 8 (eight) hours as needed. 02/28/20 02/27/21  Franchot Gallo, MD  oxyCODONE-acetaminophen (PERCOCET/ROXICET) 5-325 MG tablet Take 1 tablet by mouth every 6 (six) hours as needed for severe pain. 02/09/20   Robinson, [place] N, PA-C  tamsulosin (FLOMAX) 0.4 MG CAPS capsule Take 1 capsule (0.4 mg total) by mouth daily. 02/09/20   Robinson, [place] N, PA-C    Allergies:  No Known Allergies  Social History:  reports that she has never smoked. She has never used smokeless tobacco. No history on file for alcohol  use and drug use.  Family History: History reviewed. No pertinent family history.   Objective   Physical Exam: Blood pressure 135/69, pulse 67, temperature 98.6 F (37 C), temperature source Oral, resp. rate 15, height 5\' 4"  (1.626 m), weight 80.3 kg, SpO2 95 %.  Physical Exam Vitals and nursing note reviewed.  Constitutional:      Appearance: Normal appearance.  HENT:     Head: Normocephalic and atraumatic.  Eyes:     Conjunctiva/sclera: Conjunctivae normal.  Cardiovascular:     Rate and Rhythm: Normal rate and regular rhythm.  Pulmonary:     Effort: Pulmonary effort is normal.     Breath sounds: Normal breath sounds.   Abdominal:     General: Abdomen is flat.     Palpations: Abdomen is soft.  Musculoskeletal:        General: No swelling or tenderness.  Skin:    Coloration: Skin is not jaundiced or pale.  Neurological:     Mental Status: She is alert. Mental status is at baseline.  Psychiatric:        Mood and Affect: Mood normal.        Behavior: Behavior normal.     LABS on Admission: I have personally reviewed all the labs and imaging below    Basic Metabolic Panel: Recent Labs  Lab 07/10/20 1206  NA 140  K 2.8*  CL 101  CO2 26  GLUCOSE 99  BUN 9  CREATININE 0.66  CALCIUM 8.8*  MG 2.2   Liver Function Tests: Recent Labs  Lab 07/10/20 1206  AST 26  ALT 23  ALKPHOS 52  BILITOT 0.8  PROT 7.4  ALBUMIN 3.6   No results for input(s): LIPASE, AMYLASE in the last 168 hours. No results for input(s): AMMONIA in the last 168 hours. CBC: Recent Labs  Lab 07/10/20 1206  WBC 6.0  NEUTROABS 4.3  HGB 13.5  HCT 40.4  MCV 90.2  PLT 212   Cardiac Enzymes: No results for input(s): CKTOTAL, CKMB, CKMBINDEX, TROPONINI in the last 168 hours. BNP: Invalid input(s): POCBNP CBG: No results for input(s): GLUCAP in the last 168 hours.  Radiological Exams on Admission:  CT ANGIO CHEST PE W OR WO CONTRAST  Result Date: 07/10/2020 CLINICAL DATA:  COVID positive, shortness of breath, cough EXAM: CT ANGIOGRAPHY CHEST WITH CONTRAST TECHNIQUE: Multidetector CT imaging of the chest was performed using the standard protocol during bolus administration of intravenous contrast. Multiplanar CT image reconstructions and MIPs were obtained to evaluate the vascular anatomy. CONTRAST:  52mL OMNIPAQUE IOHEXOL 350 MG/ML SOLN COMPARISON:  None. FINDINGS: Cardiovascular: Satisfactory opacification of the pulmonary arteries to the segmental level. No evidence of pulmonary embolism. Normal heart size. No pericardial effusion. Scattered aortic atherosclerosis. Mediastinum/Nodes: No enlarged mediastinal, hilar, or  axillary lymph nodes. Thyroid gland, trachea, and esophagus demonstrate no significant findings. Lungs/Pleura: There is extensive heterogeneous and ground-glass opacity throughout the lungs, somewhat irregular and bandlike at the periphery with some evidence of subpleural sparing. No pleural effusion or pneumothorax. Upper Abdomen: No acute abnormality. Musculoskeletal: No chest wall abnormality. No acute or significant osseous findings. Review of the MIP images confirms the above findings. IMPRESSION: 1. Negative examination for pulmonary embolism. 2. There is extensive heterogeneous and ground-glass opacity throughout the lungs, somewhat irregular and bandlike at the periphery with some evidence of subpleural sparing. Findings are in keeping with acute to subacute COVID airspace disease. Aortic Atherosclerosis (ICD10-I70.0). Electronically Signed   By: Eddie Candle M.D.   On:  07/10/2020 14:26   DG Chest Port 1 View  Result Date: 07/10/2020 CLINICAL DATA:  dyspnea EXAM: PORTABLE CHEST 1 VIEW COMPARISON:  04/11/2016. FINDINGS: No pneumothorax or pleural effusion. Mild hypoinflation. Patchy bilateral mid to lower lung opacities. Stable cardiomediastinal silhouette. No acute osseous abnormality. IMPRESSION: Patchy bilateral pulmonary opacities concerning for infection. Electronically Signed   By: Primitivo Gauze M.D.   On: 07/10/2020 11:54   VAS [place] LOWER EXTREMITY VENOUS (DVT)  Result Date: 07/10/2020  Lower Venous DVT Study Indications: Elevated Ddimer.  Risk Factors: COVID 19 positive. Comparison Study: No prior studies. Performing Technologist: Oliver Hum RVT  Examination Guidelines: A complete evaluation includes B-mode imaging, spectral Doppler, color Doppler, and power Doppler as needed of all accessible portions of each vessel. Bilateral testing is considered an integral part of a complete examination. Limited examinations for reoccurring indications may be performed as noted. The reflux portion  of the exam is performed with the patient in reverse Trendelenburg.  +---------+---------------+---------+-----------+----------+--------------+ RIGHT    CompressibilityPhasicitySpontaneityPropertiesThrombus Aging +---------+---------------+---------+-----------+----------+--------------+ CFV      Full           Yes      Yes                                 +---------+---------------+---------+-----------+----------+--------------+ SFJ      Full                                                        +---------+---------------+---------+-----------+----------+--------------+ FV Prox  Full                                                        +---------+---------------+---------+-----------+----------+--------------+ FV Mid   Full                                                        +---------+---------------+---------+-----------+----------+--------------+ FV DistalFull                                                        +---------+---------------+---------+-----------+----------+--------------+ PFV      Full                                                        +---------+---------------+---------+-----------+----------+--------------+ POP      Full           Yes      Yes                                 +---------+---------------+---------+-----------+----------+--------------+ PTV  Full                                                        +---------+---------------+---------+-----------+----------+--------------+ PERO     Full                                                        +---------+---------------+---------+-----------+----------+--------------+   +---------+---------------+---------+-----------+----------+--------------+ LEFT     CompressibilityPhasicitySpontaneityPropertiesThrombus Aging +---------+---------------+---------+-----------+----------+--------------+ CFV      Full           Yes      Yes                                  +---------+---------------+---------+-----------+----------+--------------+ SFJ      Full                                                        +---------+---------------+---------+-----------+----------+--------------+ FV Prox  Full                                                        +---------+---------------+---------+-----------+----------+--------------+ FV Mid   Full                                                        +---------+---------------+---------+-----------+----------+--------------+ FV DistalFull                                                        +---------+---------------+---------+-----------+----------+--------------+ PFV      Full                                                        +---------+---------------+---------+-----------+----------+--------------+ POP      Full           Yes      Yes                                 +---------+---------------+---------+-----------+----------+--------------+ PTV      Full                                                        +---------+---------------+---------+-----------+----------+--------------+  PERO     Full                                                        +---------+---------------+---------+-----------+----------+--------------+     Summary: RIGHT: - There is no evidence of deep vein thrombosis in the lower extremity.  - No cystic structure found in the popliteal fossa.  LEFT: - There is no evidence of deep vein thrombosis in the lower extremity.  - No cystic structure found in the popliteal fossa.  *See table(s) above for measurements and observations. Electronically signed by Sherald Hess MD on 07/10/2020 at 2:46:26 PM.    Final       EKG: normal EKG, normal sinus rhythm   A & P   Principal Problem:   COVID-19 Active Problems:   Positive D-dimer   Aortic atherosclerosis (HCC)   Anxiety about health   1. COVID-19 a. Vaccinated x2  with [employer], most recent vaccine 6+ months ago b. Hypoxic to 89% on room air per ED note requiring 2 L/min [place] c. CXR and CTA chest consistent with COVID-19 d. Start remdesivir e. Start steroids f. CRP elevated, start baricitinib-risks and benefits discussed with the patient agrees to starting g. Incentive spirometry h. Trend inflammatory markers  2. Elevated D-dimer a. D-dimer >20 b. Bilateral lower extremity Doppler negative as well as negative CTA chest for PE c. Continue full dose heparin for now and trend D-dimer  3. Anxiety a. Ativan 0.5 mg IV every 6 hours as needed  4. Aortic atherosclerosis a. Lipid panel  DVT prophylaxis: Heparin   Code Status: DNR  Diet: Regular Family Communication: Admission, patients condition and plan of care including tests being ordered have been discussed with the patient who indicates understanding and agrees with the plan and Code Status. Disposition Plan: The appropriate patient status for this patient is INPATIENT. Inpatient status is judged to be reasonable and necessary in order to provide the required intensity of service to ensure the patient's safety. The patient's presenting symptoms, physical exam findings, and initial radiographic and laboratory data in the context of their chronic comorbidities is felt to place them at high risk for further clinical deterioration. Furthermore, it is not anticipated that the patient will be medically stable for discharge from the hospital within 2 midnights of admission. The following factors support the patient status of inpatient.   " The patient's presenting symptoms include shortness of breath. " The worrisome physical exam findings include hypoxia on room air. " The initial radiographic and laboratory data are worrisome because of elevated CRP and D-dimer. " The chronic co-morbidities are unremarkable.   * I certify that at the point of admission it is my clinical judgment that the patient will  require inpatient hospital care spanning beyond 2 midnights from the point of admission due to high intensity of service, high risk for further deterioration and high frequency of surveillance required.*   Consultants  . None  Procedures  . None  Time Spent on Admission: 65 minutes    Jae Dire, DO [facility]  07/10/2020, 4:03 PM

## 2020-07-10 NOTE — Progress Notes (Signed)
Bilateral lower extremity venous duplex has been completed. Preliminary results can be found in CV Proc through chart review.  Results were given to Dr. Dairl Ponder.  07/10/20 2:32 PM Olen Cordial RVT

## 2020-07-11 LAB — CBC WITH DIFFERENTIAL/PLATELET
Abs Immature Granulocytes: 0.1 10*3/uL — ABNORMAL HIGH (ref 0.00–0.07)
Basophils Absolute: 0 10*3/uL (ref 0.0–0.1)
Basophils Relative: 0 %
Eosinophils Absolute: 0 10*3/uL (ref 0.0–0.5)
Eosinophils Relative: 0 %
HCT: 39.6 % (ref 36.0–46.0)
Hemoglobin: 13.3 g/dL (ref 12.0–15.0)
Immature Granulocytes: 2 %
Lymphocytes Relative: 12 %
Lymphs Abs: 0.7 10*3/uL (ref 0.7–4.0)
MCH: 30.3 pg (ref 26.0–34.0)
MCHC: 33.6 g/dL (ref 30.0–36.0)
MCV: 90.2 fL (ref 80.0–100.0)
Monocytes Absolute: 0.4 10*3/uL (ref 0.1–1.0)
Monocytes Relative: 8 %
Neutro Abs: 4.1 10*3/uL (ref 1.7–7.7)
Neutrophils Relative %: 78 %
Platelets: 241 10*3/uL (ref 150–400)
RBC: 4.39 MIL/uL (ref 3.87–5.11)
RDW: 12 % (ref 11.5–15.5)
WBC: 5.4 10*3/uL (ref 4.0–10.5)
nRBC: 0 % (ref 0.0–0.2)

## 2020-07-11 LAB — COMPREHENSIVE METABOLIC PANEL
ALT: 22 U/L (ref 0–44)
AST: 21 U/L (ref 15–41)
Albumin: 3.3 g/dL — ABNORMAL LOW (ref 3.5–5.0)
Alkaline Phosphatase: 48 U/L (ref 38–126)
Anion gap: 12 (ref 5–15)
BUN: 13 mg/dL (ref 8–23)
CO2: 25 mmol/L (ref 22–32)
Calcium: 8.7 mg/dL — ABNORMAL LOW (ref 8.9–10.3)
Chloride: 103 mmol/L (ref 98–111)
Creatinine, Ser: 0.58 mg/dL (ref 0.44–1.00)
GFR, Estimated: >60 mL/min (ref >60)
Glucose, Bld: 129 mg/dL — ABNORMAL HIGH (ref 70–99)
Potassium: 3.6 mmol/L (ref 3.5–5.1)
Sodium: 140 mmol/L (ref 135–145)
Total Bilirubin: 0.7 mg/dL (ref 0.3–1.2)
Total Protein: 6.9 g/dL (ref 6.5–8.1)

## 2020-07-11 LAB — C-REACTIVE PROTEIN: CRP: 6.6 mg/dL — ABNORMAL HIGH (ref <1.0)

## 2020-07-11 LAB — HEPARIN LEVEL (UNFRACTIONATED): Heparin Unfractionated: 0.58 IU/mL (ref 0.30–0.70)

## 2020-07-11 LAB — D-DIMER, QUANTITATIVE: D-Dimer, Quant: >20 ug/mL-FEU — ABNORMAL HIGH (ref 0.00–0.50)

## 2020-07-11 LAB — FERRITIN: Ferritin: 457 ng/mL — ABNORMAL HIGH (ref 11–307)

## 2020-07-11 MED ORDER — ENOXAPARIN SODIUM 40 MG/0.4ML ~~LOC~~ SOLN
40.0000 mg | SUBCUTANEOUS | Status: DC
  Administered 2020-07-11 – 2020-07-13 (×3): 40 mg via SUBCUTANEOUS
  Filled 2020-07-11 (×3): qty 0.4

## 2020-07-11 MED ORDER — METHYLPREDNISOLONE SODIUM SUCC 125 MG IJ SOLR
80.0000 mg | Freq: Two times a day (BID) | INTRAMUSCULAR | Status: DC
  Administered 2020-07-11 – 2020-07-13 (×5): 80 mg via INTRAVENOUS
  Filled 2020-07-11 (×4): qty 2

## 2020-07-11 NOTE — Plan of Care (Signed)
  Problem: Clinical Measurements: Goal: Ability to maintain clinical measurements within normal limits will improve Outcome: Progressing Goal: Will remain free from infection Outcome: Progressing Goal: Diagnostic test results will improve Outcome: Progressing   Problem: Activity: Goal: Risk for activity intolerance will decrease 07/11/2020 1401 by Adolphus Birchwood, RN Outcome: Progressing 07/11/2020 1400 by Adolphus Birchwood, RN Outcome: Progressing   Problem: Pain Managment: Goal: General experience of comfort will improve Outcome: Progressing   Problem: Safety: Goal: Ability to remain free from injury will improve 07/11/2020 1401 by Adolphus Birchwood, RN Outcome: Progressing 07/11/2020 1400 by Adolphus Birchwood, RN Outcome: Progressing   Problem: Education: Goal: Knowledge of risk factors and measures for prevention of condition will improve Outcome: Progressing   Problem: Coping: Goal: Psychosocial and spiritual needs will be supported Outcome: Progressing   Problem: Respiratory: Goal: Will maintain a patent airway Outcome: Progressing

## 2020-07-11 NOTE — Progress Notes (Signed)
Patient came in via stretcher accompanied by ED RN and tech. Patient is alert and oriented, on 3L oxygen [place]. Pt. Is oriented to the unit and use of call bell. Skin assessment done, no skin issues. Pt. Needs attended to. Pt. Monitored closely.

## 2020-07-11 NOTE — Progress Notes (Signed)
[facility] Progress Note  Patient: Suzanne Collins    [id number]  DOA: 07/10/2020     Date of Service: the patient was seen and examined on 07/11/2020  Brief hospital course: No significant past medical history.  Presents with complaints of cough and shortness of breath.  Presents with COVID-19 pneumonia. Currently plan is continue current care.  Assessment and Plan: 1. Acute COVID-19 Viral Pneumonia CXR: hazy bilateral peripheral opacities CT chest: GGO, consolidation Oxygen requirement: 2 LPM CRP: 10.5-6.6 Remdesivir: Initiated on 07/10/2020 Steroids: On IV steroids Baricitinib/Actemra(off-label use): Started on 1/4 The investigational nature of this medication was discussed with the patient/HCPOA and they choose to proceed as the potential benefits are felt to outweigh risks at this time.  Antibiotics: Not indicated Vitamin C and Zinc: Continue DVT Prophylaxis: enoxaparin (LOVENOX) injection 40 mg Start: 07/11/20 1000 Prone positioning and incentive spirometer use recommended.  The treatment plan and use of medications and known side effects were discussed with patient/family. It was clearly explained that Complete risks and long-term side effects are unknown, however in the best clinical judgment they seem to be of some clinical benefit rather than medical risks. Patient/family agree with the treatment plan and want to receive these treatments as indicated.   2.  D-dimer elevation Lower extremity Doppler negative. CTA negative for PE. On full dose heparin initially but given that the risk-benefit analysis patient currently will be transition back to Lovenox for DVT prophylaxis. Monitor.  3.  Anxiety On Ativan.  Monitor.  4.  Obesity Placing the patient at high risk for poor outcome Body mass index is 30.38 kg/m.   Diet: Cardiac diet DVT Prophylaxis:   enoxaparin (LOVENOX) injection 40 mg Start: 07/11/20 1000    Advance goals of care discussion: DNR  Family  Communication: no family was present at bedside, at the time of interview.   Disposition:  Status is: Inpatient  Remains inpatient appropriate because:Inpatient level of care appropriate due to severity of illness   Dispo:  Patient From: Home  Planned Disposition: Home with Health Care Svc  Expected discharge date: 07/14/2020  Medically stable for discharge: No         Subjective: Continues to have shortness of breath but no nausea no vomiting but no fever no chills.  Physical Exam:  General: Appear in mild distress, no Rash; Oral Mucosa Clear, moist. no Abnormal Neck Mass Or lumps, Conjunctiva normal  Cardiovascular: S1 and S2 Present, no Murmur, Respiratory: increased respiratory effort, Bilateral Air entry present and bilateral  Crackles, no wheezes Abdomen: Bowel Sound present, Soft and no tenderness Extremities: trace Pedal edema Neurology: alert and oriented to time, place, and person affect appropriate. no new focal deficit Gait not checked due to patient safety concerns    Vitals:   07/11/20 1230 07/11/20 1346 07/11/20 1757 07/11/20 2016  BP: 116/83 126/82 119/90 133/80  Pulse: 60 66 79 72  Resp: 14 18 19 20   Temp:  98.2 F (36.8 C) 98 F (36.7 C) 98.3 F (36.8 C)  TempSrc:  Oral Oral Oral  SpO2: 96% 98% 95% 96%  Weight:      Height:        Intake/Output Summary (Last 24 hours) at 07/11/2020 2129 Last data filed at 07/11/2020 1500 Gross per 24 hour  Intake 359.08 ml  Output -  Net 359.08 ml   Filed Weights   07/10/20 1444  Weight: 80.3 kg    Data Reviewed: I have personally reviewed and interpreted daily labs, tele strips, imagings  as discussed above. I reviewed all nursing notes, pharmacy notes, vitals, pertinent old records I have discussed plan of care as described above with RN and patient/family.  CBC: Recent Labs  Lab 07/10/20 1206 07/11/20 0420  WBC 6.0 5.4  NEUTROABS 4.3 4.1  HGB 13.5 13.3  HCT 40.4 39.6  MCV 90.2 90.2  PLT 212 241    Basic Metabolic Panel: Recent Labs  Lab 07/10/20 1206 07/11/20 0420  NA 140 140  K 2.8* 3.6  CL 101 103  CO2 26 25  GLUCOSE 99 129*  BUN 9 13  CREATININE 0.66 0.58  CALCIUM 8.8* 8.7*  MG 2.2  --     Studies: No results found.  Scheduled Meds: . baricitinib  4 mg Oral Daily  . enoxaparin (LOVENOX) injection  40 mg Subcutaneous Q24H  . methylPREDNISolone (SOLU-MEDROL) injection  80 mg Intravenous BID  . potassium chloride  40 mEq Oral Once  . sodium chloride flush  3 mL Intravenous Q12H   Continuous Infusions: . remdesivir 100 mg in NS 100 mL 100 mg (07/11/20 1042)   PRN Meds: acetaminophen, guaiFENesin-dextromethorphan, Ipratropium-Albuterol, LORazepam, ondansetron **OR** ondansetron (ZOFRAN) IV  Time spent: 35 minutes  Author: Lynden Oxford, MD [facility] 07/11/2020 9:29 PM  To reach On-call, see care teams to locate the attending and reach out via [email or web address]. Between 7PM-7AM, please contact night-coverage If you still have difficulty reaching the attending provider, please page the [facility] (Director on Call) for [facility] on amion for assistance.

## 2020-07-11 NOTE — Plan of Care (Signed)
  Problem: Clinical Measurements: Goal: Ability to maintain clinical measurements within normal limits will improve Outcome: Progressing Goal: Will remain free from infection Outcome: Progressing Goal: Diagnostic test results will improve Outcome: Progressing   Problem: Activity: Goal: Risk for activity intolerance will decrease Outcome: Progressing   Problem: Pain Managment: Goal: General experience of comfort will improve Outcome: Progressing   Problem: Safety: Goal: Ability to remain free from injury will improve Outcome: Progressing   

## 2020-07-12 LAB — CBC WITH DIFFERENTIAL/PLATELET
Abs Immature Granulocytes: 0.19 10*3/uL — ABNORMAL HIGH (ref 0.00–0.07)
Basophils Absolute: 0.1 10*3/uL (ref 0.0–0.1)
Basophils Relative: 1 %
Eosinophils Absolute: 0 10*3/uL (ref 0.0–0.5)
Eosinophils Relative: 0 %
HCT: 42.8 % (ref 36.0–46.0)
Hemoglobin: 14 g/dL (ref 12.0–15.0)
Immature Granulocytes: 3 %
Lymphocytes Relative: 10 %
Lymphs Abs: 0.7 10*3/uL (ref 0.7–4.0)
MCH: 30.4 pg (ref 26.0–34.0)
MCHC: 32.7 g/dL (ref 30.0–36.0)
MCV: 93 fL (ref 80.0–100.0)
Monocytes Absolute: 0.3 10*3/uL (ref 0.1–1.0)
Monocytes Relative: 4 %
Neutro Abs: 6.4 10*3/uL (ref 1.7–7.7)
Neutrophils Relative %: 82 %
Platelets: 276 10*3/uL (ref 150–400)
RBC: 4.6 MIL/uL (ref 3.87–5.11)
RDW: 12 % (ref 11.5–15.5)
WBC: 7.7 10*3/uL (ref 4.0–10.5)
nRBC: 0 % (ref 0.0–0.2)

## 2020-07-12 LAB — FERRITIN: Ferritin: 413 ng/mL — ABNORMAL HIGH (ref 11–307)

## 2020-07-12 LAB — COMPREHENSIVE METABOLIC PANEL
ALT: 26 U/L (ref 0–44)
AST: 23 U/L (ref 15–41)
Albumin: 3.3 g/dL — ABNORMAL LOW (ref 3.5–5.0)
Alkaline Phosphatase: 46 U/L (ref 38–126)
Anion gap: 11 (ref 5–15)
BUN: 14 mg/dL (ref 8–23)
CO2: 23 mmol/L (ref 22–32)
Calcium: 8.8 mg/dL — ABNORMAL LOW (ref 8.9–10.3)
Chloride: 104 mmol/L (ref 98–111)
Creatinine, Ser: 0.52 mg/dL (ref 0.44–1.00)
GFR, Estimated: >60 mL/min (ref >60)
Glucose, Bld: 154 mg/dL — ABNORMAL HIGH (ref 70–99)
Potassium: 3.8 mmol/L (ref 3.5–5.1)
Sodium: 138 mmol/L (ref 135–145)
Total Bilirubin: 0.7 mg/dL (ref 0.3–1.2)
Total Protein: 6.8 g/dL (ref 6.5–8.1)

## 2020-07-12 LAB — D-DIMER, QUANTITATIVE: D-Dimer, Quant: >20 ug/mL-FEU — ABNORMAL HIGH (ref 0.00–0.50)

## 2020-07-12 LAB — C-REACTIVE PROTEIN: CRP: 3.5 mg/dL — ABNORMAL HIGH (ref <1.0)

## 2020-07-12 MED ORDER — SALINE SPRAY 0.65 % NA SOLN
1.0000 | NASAL | Status: DC | PRN
  Administered 2020-07-12: 1 via NASAL
  Filled 2020-07-12: qty 44

## 2020-07-12 NOTE — Progress Notes (Signed)
[facility] Progress Note  Patient: Suzanne Collins    [id number]  DOA: 07/10/2020     Date of Service: the patient was seen and examined on 07/12/2020  Brief hospital course: No significant past medical history.  Presents with complaints of cough and shortness of breath.  Presents with COVID-19 pneumonia. Currently plan is continue current care.  Assessment and Plan: 1. Acute COVID-19 Viral Pneumonia CXR: hazy bilateral peripheral opacities CT chest: GGO, consolidation Oxygen requirement: 2 LPM CRP: 10.5-6.6 Remdesivir: Initiated on 07/10/2020 Steroids: On IV steroids Baricitinib/Actemra(off-label use): Started on 1/4 The investigational nature of this medication was discussed with the patient/HCPOA and they choose to proceed as the potential benefits are felt to outweigh risks at this time.  Antibiotics: Not indicated Vitamin C and Zinc: Continue DVT Prophylaxis: enoxaparin (LOVENOX) injection 40 mg Start: 07/11/20 1000 Prone positioning and incentive spirometer use recommended.  The treatment plan and use of medications and known side effects were discussed with patient/family. It was clearly explained that Complete risks and long-term side effects are unknown, however in the best clinical judgment they seem to be of some clinical benefit rather than medical risks. Patient/family agree with the treatment plan and want to receive these treatments as indicated.   07/12/20 Patient trying to self wean from O2 supplementation but O2 sats dropping to 70s while she was talking. Patient advised to continue using O2 supplementation. Will need a walk test prior to discharge. Will likely need home O2 supplementation.   2.  D-dimer elevation Lower extremity Doppler negative. CTA negative for PE. On full dose heparin initially but given that the risk-benefit analysis patient currently will be transition back to Lovenox for DVT prophylaxis. Monitor.   3.  Anxiety On Ativan.  Monitor.  4.   Obesity Placing the patient at high risk for poor outcome Body mass index is 30.38 kg/m.   Diet: Cardiac diet DVT Prophylaxis:   enoxaparin (LOVENOX) injection 40 mg Start: 07/11/20 1000    Advance goals of care discussion: DNR  Family Communication: Her family was on speaker phone (on patient's phone) during this encounter and update on today's plan.   Disposition:  Status is: Inpatient  Remains inpatient appropriate because:Inpatient level of care appropriate due to severity of illness   Dispo:  Patient From: Home  Planned Disposition: Home with [facility]  Expected discharge date: 07/13/2020  Medically stable for discharge: No         Subjective:  Patient with desat to 70s while resting and talking. Needs to continue O2 supplementation. Will need walk test in am to eval for O2 supplementation needs.   Physical Exam:  General: Sitting up in chair, tired appearing, in NAD.  Cardiovascular: S1 and S2 Present, RRR. Respiratory: Normal effort, no cough.  Abdomen: Soft, NT.  Extremities: trace Pedal edema Neurology: alert and oriented to time, place, and person affect appropriate.  Vitals:   07/11/20 1757 07/11/20 2016 07/12/20 0152 07/12/20 1359  BP: 119/90 133/80 (!) 139/93 117/76  Pulse: 79 72 62 71  Resp: 19 20 (!) 22 20  Temp: 98 F (36.7 C) 98.3 F (36.8 C) 97.9 F (36.6 C) 97.7 F (36.5 C)  TempSrc: Oral Oral  Oral  SpO2: 95% 96% 99% 98%  Weight:      Height:       No intake or output data in the 24 hours ending 07/12/20 1755 Filed Weights   07/10/20 1444  Weight: 80.3 kg    Data Reviewed: I have  personally reviewed and interpreted daily labs,pharmacy notes, vitals, pertinent old records I have discussed plan of care as described above with RN and patient/family.  CBC: Recent Labs  Lab 07/10/20 1206 07/11/20 0420 07/12/20 0529  WBC 6.0 5.4 7.7  NEUTROABS 4.3 4.1 6.4  HGB 13.5 13.3 14.0  HCT 40.4 39.6 42.8  MCV 90.2 90.2 93.0  PLT  212 241 276   Basic Metabolic Panel: Recent Labs  Lab 07/10/20 1206 07/11/20 0420 07/12/20 0529  NA 140 140 138  K 2.8* 3.6 3.8  CL 101 103 104  CO2 26 25 23   GLUCOSE 99 129* 154*  BUN 9 13 14   CREATININE 0.66 0.58 0.52  CALCIUM 8.8* 8.7* 8.8*  MG 2.2  --   --     Studies: No results found.  Scheduled Meds: . baricitinib  4 mg Oral Daily  . enoxaparin (LOVENOX) injection  40 mg Subcutaneous Q24H  . methylPREDNISolone (SOLU-MEDROL) injection  80 mg Intravenous BID  . potassium chloride  40 mEq Oral Once  . sodium chloride flush  3 mL Intravenous Q12H   Continuous Infusions: . remdesivir 100 mg in NS 100 mL 100 mg (07/12/20 0857)   PRN Meds: acetaminophen, guaiFENesin-dextromethorphan, Ipratropium-Albuterol, LORazepam, ondansetron **OR** ondansetron (ZOFRAN) IV  Time spent: 35 minutes  Author: , MD [facility] 07/12/2020 5:55 PM

## 2020-07-12 NOTE — Plan of Care (Signed)
  Problem: Clinical Measurements: Goal: Ability to maintain clinical measurements within normal limits will improve Outcome: Progressing Goal: Will remain free from infection Outcome: Progressing Goal: Diagnostic test results will improve Outcome: Progressing   Problem: Activity: Goal: Risk for activity intolerance will decrease Outcome: Progressing   Problem: Pain Managment: Goal: General experience of comfort will improve Outcome: Progressing   

## 2020-07-13 LAB — COMPREHENSIVE METABOLIC PANEL
ALT: 27 U/L (ref 0–44)
AST: 20 U/L (ref 15–41)
Albumin: 3.2 g/dL — ABNORMAL LOW (ref 3.5–5.0)
Alkaline Phosphatase: 52 U/L (ref 38–126)
Anion gap: 11 (ref 5–15)
BUN: 19 mg/dL (ref 8–23)
CO2: 23 mmol/L (ref 22–32)
Calcium: 8.5 mg/dL — ABNORMAL LOW (ref 8.9–10.3)
Chloride: 104 mmol/L (ref 98–111)
Creatinine, Ser: 0.7 mg/dL (ref 0.44–1.00)
GFR, Estimated: >60 mL/min (ref >60)
Glucose, Bld: 189 mg/dL — ABNORMAL HIGH (ref 70–99)
Potassium: 3.7 mmol/L (ref 3.5–5.1)
Sodium: 138 mmol/L (ref 135–145)
Total Bilirubin: 0.5 mg/dL (ref 0.3–1.2)
Total Protein: 6.5 g/dL (ref 6.5–8.1)

## 2020-07-13 LAB — D-DIMER, QUANTITATIVE: D-Dimer, Quant: >20 ug/mL-FEU — ABNORMAL HIGH (ref 0.00–0.50)

## 2020-07-13 LAB — CBC WITH DIFFERENTIAL/PLATELET
Abs Immature Granulocytes: 0.46 10*3/uL — ABNORMAL HIGH (ref 0.00–0.07)
Basophils Absolute: 0.1 10*3/uL (ref 0.0–0.1)
Basophils Relative: 1 %
Eosinophils Absolute: 0 10*3/uL (ref 0.0–0.5)
Eosinophils Relative: 0 %
HCT: 39.2 % (ref 36.0–46.0)
Hemoglobin: 13 g/dL (ref 12.0–15.0)
Immature Granulocytes: 4 %
Lymphocytes Relative: 8 %
Lymphs Abs: 0.8 10*3/uL (ref 0.7–4.0)
MCH: 30.1 pg (ref 26.0–34.0)
MCHC: 33.2 g/dL (ref 30.0–36.0)
MCV: 90.7 fL (ref 80.0–100.0)
Monocytes Absolute: 0.5 10*3/uL (ref 0.1–1.0)
Monocytes Relative: 5 %
Neutro Abs: 8.6 10*3/uL — ABNORMAL HIGH (ref 1.7–7.7)
Neutrophils Relative %: 82 %
Platelets: 310 10*3/uL (ref 150–400)
RBC: 4.32 MIL/uL (ref 3.87–5.11)
RDW: 11.9 % (ref 11.5–15.5)
WBC: 10.4 10*3/uL (ref 4.0–10.5)
nRBC: 0 % (ref 0.0–0.2)

## 2020-07-13 LAB — C-REACTIVE PROTEIN: CRP: 1.7 mg/dL — ABNORMAL HIGH (ref <1.0)

## 2020-07-13 LAB — FERRITIN: Ferritin: 348 ng/mL — ABNORMAL HIGH (ref 11–307)

## 2020-07-13 MED ORDER — RIVAROXABAN 10 MG PO TABS: 10.0000 mg | ORAL_TABLET | Freq: Two times a day (BID) | ORAL | 0 refills | Status: DC

## 2020-07-13 NOTE — Progress Notes (Signed)
SATURATION QUALIFICATIONS:   Patient Saturations on Room Air at Rest = 98%  Patient Saturations on Room Air while Ambulating =95-97%  Patient Saturations on 1 Liters of oxygen while Ambulating 92-96%  Patient can tolerate ambulation without oxygen. Educated to have rest periods when needed once discharge home.

## 2020-07-13 NOTE — Discharge Summary (Signed)
Physician Discharge Summary  Suzanne Collins FV:[id number] DOB: 03/04/1956 DOA: 07/10/2020  PCP: Ramiro Harvest, PA-C  Admit date: 07/10/2020 Discharge date: 07/13/2020  Admitted From: Home  Disposition:  Home  Recommendations for Outpatient Follow-up:  1. Follow up with PCP in 1 weeks, may need repeat D-dimer in 2-3 weeks and decision to stop or continue Xarelto (the patient will further discuss this prophylactic treatment medication with her PCP).    Home Health: No Equipment/Devices: No  Discharge Condition:Stable CODE STATUS: Full Diet recommendation: Heart Healthy  Brief/Interim Summary: Her hypoxia has resolved and she has successfully been weaned off O2 supplementation with stable O2 sats on RA, even with ambulation. Overall she feels much better.  She was concerned about her persistently elevated D-dimer of >20.00 and we discussed the current guidelines with Xarelto 10mg  po BID x 31 days as an option for low bleeding risk patient with COVID (recommendation is a category BI). The risks of taking this medication were discussed with her in detail which include the increase risk of bleeding. At this time she would like a prescription for this medication which was provided at the time of discharge. She was encouraged to discuss this treatment plan with her PCP before starting it. She may have repeat D-dimer in 2-3 weeks for further management of her hypercoagulable state and risk for VTE. Of note, she had CTA negative for PE and Doppler US of BL LE negative for DVT.  She will not continue treatment with steroids after discharge as she no longer is hypoxic. She completed 4 days of remdesivir infusion with the last dose not needed as her symptoms improved and her hypoxia resolved.   Please see below for further details for this hospital course prior to the day of her discharge:  1. Acute COVID-19 Viral Pneumonia CXR: hazy bilateral peripheral opacities CT chest: GGO, consolidation Oxygen  requirement: 2 LPM CRP: 10.5-6.6 Remdesivir: Initiated on 07/10/2020 Steroids: On IV steroids Baricitinib/Actemra(off-label use): Started on 1/4 The investigational nature of this medication was discussed with the patient/HCPOA and they choose to proceed as the potential benefits are felt to outweigh risks at this time.  Antibiotics: Not indicated Vitamin C and Zinc: Continue DVT Prophylaxis: enoxaparin (LOVENOX) injection 40 mg Start: 07/11/20 1000 Prone positioning and incentive spirometer use recommended.  The treatment plan and use of medications and known side effects were discussed with patient/family. It was clearly explained that Complete risks and long-term side effects are unknown, however in the best clinical judgment they seem to be of some clinical benefit rather than medical risks. Patient/family agree with the treatment plan and want to receive these treatments as indicated.   07/12/20 Patient trying to self wean from O2 supplementation but O2 sats dropping to 70s while she was talking. Patient advised to continue using O2 supplementation. Will need a walk test prior to discharge. Will likely need home O2 supplementation.   2.  D-dimer elevation Lower extremity Doppler negative. CTA negative for PE. On full dose heparin initially but given that the risk-benefit analysis patient currently will be transition back to Lovenox for DVT prophylaxis. Monitor.   3.  Anxiety On Ativan.  Monitor.  4.  Obesity Placing the patient at high risk for poor outcome Body mass index is 30.38 kg/m.   Discharge Diagnoses:  Principal Problem:   COVID-19 Active Problems:   Positive D-dimer   Aortic atherosclerosis (HCC)   Anxiety about health    Discharge Instructions  Discharge Instructions    Diet -  low sodium heart healthy   Complete by: As directed    Increase activity slowly   Complete by: As directed      Allergies as of 07/13/2020   No Known Allergies     Medication  List    STOP taking these medications   azithromycin 250 MG tablet Commonly known as: ZITHROMAX   predniSONE 10 MG tablet Commonly known as: DELTASONE     TAKE these medications   acetaminophen 500 MG tablet Commonly known as: TYLENOL Take 1,000 mg by mouth every 6 (six) hours as needed.   Claritin-D 24 Hour 10-240 MG 24 hr tablet Generic drug: loratadine-pseudoephedrine Take 1 tablet by mouth daily.   NONFORMULARY OR COMPOUNDED ITEM Take 32 mg by mouth daily. Compounded Ivermectin   ondansetron 4 MG disintegrating tablet Commonly known as: Zofran ODT Take 1 tablet (4 mg total) by mouth every 8 (eight) hours as needed for nausea or vomiting.   oxyCODONE 5 MG immediate release tablet Commonly known as: Roxicodone Take 1 tablet (5 mg total) by mouth every 8 (eight) hours as needed.   oxyCODONE-acetaminophen 5-325 MG tablet Commonly known as: PERCOCET/ROXICET Take 1 tablet by mouth every 6 (six) hours as needed for severe pain.   rivaroxaban 10 MG Tabs tablet Commonly known as: XARELTO Take 1 tablet (10 mg total) by mouth in the morning and at bedtime. Do not take aspirin, Motrin, Ibuprofen, Aleve, or Advil while taking this medication. Talk to your primary care provider if you should continue taking this medication beyond 31 days.   tamsulosin 0.4 MG Caps capsule Commonly known as: FLOMAX Take 1 capsule (0.4 mg total) by mouth daily.       Follow-up Information    Ramiro Harvest, PA-C. Schedule an appointment as soon as possible for a visit.   Specialty: Family Medicine Why: 1-4 days Contact information: [place] [address] [phone]              No Known Allergies     Procedures/Studies: CT ANGIO CHEST PE W OR WO CONTRAST  Result Date: 07/10/2020 CLINICAL DATA:  COVID positive, shortness of breath, cough EXAM: CT ANGIOGRAPHY CHEST WITH CONTRAST TECHNIQUE: Multidetector CT imaging of the chest was performed using the standard  protocol during bolus administration of intravenous contrast. Multiplanar CT image reconstructions and MIPs were obtained to evaluate the vascular anatomy. CONTRAST:  76mL OMNIPAQUE IOHEXOL 350 MG/ML SOLN COMPARISON:  None. FINDINGS: Cardiovascular: Satisfactory opacification of the pulmonary arteries to the segmental level. No evidence of pulmonary embolism. Normal heart size. No pericardial effusion. Scattered aortic atherosclerosis. Mediastinum/Nodes: No enlarged mediastinal, hilar, or axillary lymph nodes. Thyroid gland, trachea, and esophagus demonstrate no significant findings. Lungs/Pleura: There is extensive heterogeneous and ground-glass opacity throughout the lungs, somewhat irregular and bandlike at the periphery with some evidence of subpleural sparing. No pleural effusion or pneumothorax. Upper Abdomen: No acute abnormality. Musculoskeletal: No chest wall abnormality. No acute or significant osseous findings. Review of the MIP images confirms the above findings. IMPRESSION: 1. Negative examination for pulmonary embolism. 2. There is extensive heterogeneous and ground-glass opacity throughout the lungs, somewhat irregular and bandlike at the periphery with some evidence of subpleural sparing. Findings are in keeping with acute to subacute COVID airspace disease. Aortic Atherosclerosis (ICD10-I70.0). Electronically Signed   By: Eddie Candle M.D.   On: 07/10/2020 14:26   DG Chest Port 1 View  Result Date: 07/10/2020 CLINICAL DATA:  dyspnea EXAM: PORTABLE CHEST 1 VIEW COMPARISON:  04/11/2016. FINDINGS: No pneumothorax or pleural effusion. Mild hypoinflation. Patchy bilateral mid to lower lung opacities. Stable cardiomediastinal silhouette. No acute osseous abnormality. IMPRESSION: Patchy bilateral pulmonary opacities concerning for infection. Electronically Signed   By: Primitivo Gauze M.D.   On: 07/10/2020 11:54   VAS [place] LOWER EXTREMITY VENOUS (DVT)  Result Date: 07/10/2020  Lower Venous DVT  Study Indications: Elevated Ddimer.  Risk Factors: COVID 19 positive. Comparison Study: No prior studies. Performing Technologist: Oliver Hum RVT  Examination Guidelines: A complete evaluation includes B-mode imaging, spectral Doppler, color Doppler, and power Doppler as needed of all accessible portions of each vessel. Bilateral testing is considered an integral part of a complete examination. Limited examinations for reoccurring indications may be performed as noted. The reflux portion of the exam is performed with the patient in reverse Trendelenburg.  +---------+---------------+---------+-----------+----------+--------------+ RIGHT    CompressibilityPhasicitySpontaneityPropertiesThrombus Aging +---------+---------------+---------+-----------+----------+--------------+ CFV      Full           Yes      Yes                                 +---------+---------------+---------+-----------+----------+--------------+ SFJ      Full                                                        +---------+---------------+---------+-----------+----------+--------------+ FV Prox  Full                                                        +---------+---------------+---------+-----------+----------+--------------+ FV Mid   Full                                                        +---------+---------------+---------+-----------+----------+--------------+ FV DistalFull                                                        +---------+---------------+---------+-----------+----------+--------------+ PFV      Full                                                        +---------+---------------+---------+-----------+----------+--------------+ POP      Full           Yes      Yes                                 +---------+---------------+---------+-----------+----------+--------------+ PTV      Full                                                         +---------+---------------+---------+-----------+----------+--------------+  PERO     Full                                                        +---------+---------------+---------+-----------+----------+--------------+   +---------+---------------+---------+-----------+----------+--------------+ LEFT     CompressibilityPhasicitySpontaneityPropertiesThrombus Aging +---------+---------------+---------+-----------+----------+--------------+ CFV      Full           Yes      Yes                                 +---------+---------------+---------+-----------+----------+--------------+ SFJ      Full                                                        +---------+---------------+---------+-----------+----------+--------------+ FV Prox  Full                                                        +---------+---------------+---------+-----------+----------+--------------+ FV Mid   Full                                                        +---------+---------------+---------+-----------+----------+--------------+ FV DistalFull                                                        +---------+---------------+---------+-----------+----------+--------------+ PFV      Full                                                        +---------+---------------+---------+-----------+----------+--------------+ POP      Full           Yes      Yes                                 +---------+---------------+---------+-----------+----------+--------------+ PTV      Full                                                        +---------+---------------+---------+-----------+----------+--------------+ PERO     Full                                                        +---------+---------------+---------+-----------+----------+--------------+  Summary: RIGHT: - There is no evidence of deep vein thrombosis in the lower extremity.  - No cystic structure found in  the popliteal fossa.  LEFT: - There is no evidence of deep vein thrombosis in the lower extremity.  - No cystic structure found in the popliteal fossa.  *See table(s) above for measurements and observations. Electronically signed by Sherald Hess MD on 07/10/2020 at 2:46:26 PM.    Final      Subjective: She feels much better, her O2 sats are stable on RA. She is eager to go home.   Discharge Exam: Vitals:   07/13/20 0556 07/13/20 1421  BP: 135/85 123/71  Pulse: 70 78  Resp: 16 20  Temp: 98.2 F (36.8 C) 98.6 F (37 C)  SpO2: 94% 96%   Vitals:   07/12/20 1359 07/12/20 2125 07/13/20 0556 07/13/20 1421  BP: 117/76 121/76 135/85 123/71  Pulse: 71 (!) 59 70 78  Resp: 20 17 16 20   Temp: 97.7 F (36.5 C) (!) 97.5 F (36.4 C) 98.2 F (36.8 C) 98.6 F (37 C)  TempSrc: Oral Oral Oral Oral  SpO2: 98% 93% 94% 96%  Weight:      Height:        General: Pt is alert, awake, not in acute distress Cardiovascular: RRR, S1/S2 + Respiratory: No cough, normal rate Abdominal: Soft, NT Extremities: no edema, no cyanosis    The results of significant diagnostics from this hospitalization (including imaging, microbiology, ancillary and laboratory) are listed below for reference.     Microbiology: Recent Results (from the past 240 hour(s))  Resp Panel by RT-PCR (Flu A&B, Covid) Nasopharyngeal Swab     Status: Abnormal   Collection Time: 07/10/20 11:10 AM   Specimen: Nasopharyngeal Swab; Nasopharyngeal(NP) swabs in vial transport medium  Result Value Ref Range Status   SARS Coronavirus 2 by RT PCR POSITIVE (A) NEGATIVE Final    Comment: RESULT CALLED TO, READ BACK BY AND VERIFIED WITH: DOWD,P. RN @1313  ON 01.03.2022 BY COHEN,K (NOTE) SARS-CoV-2 target nucleic acids are DETECTED.  The SARS-CoV-2 RNA is generally detectable in upper respiratory specimens during the acute phase of infection. Positive results are indicative of the presence of the identified virus, but do not rule out  bacterial infection or co-infection with other pathogens not detected by the test. Clinical correlation with patient history and other diagnostic information is necessary to determine patient infection status. The expected result is Negative.  Fact Sheet for Patients:  Fact Sheet for Healthcare Providers: 03.03.2022  This test is not yet approved or cleared by the [employer] FDA and  has been authorized for detection and/or diagnosis of SARS-CoV-2 by FDA under an Emergency Use Authorization (EUA).  This EUA will remain in effect (meaning this tes t can be used) for the duration of  the COVID-19 declaration under Section 564(b)(1) of the Act, 21 U.S.C. section 360bbb-3(b)(1), unless the authorization is terminated or revoked sooner.     Influenza A by PCR NEGATIVE NEGATIVE Final   Influenza B by PCR NEGATIVE NEGATIVE Final    Comment: (NOTE) The Xpert Xpress SARS-CoV-2/FLU/RSV plus assay is intended as an aid in the diagnosis of influenza from Nasopharyngeal swab specimens and should not be used as a sole basis for treatment. Nasal washings and aspirates are unacceptable for Xpert Xpress SARS-CoV-2/FLU/RSV testing.  Fact Sheet for Patients: SeriousBroker.it  Fact Sheet for Healthcare Providers: [place]  This test is not yet approved or cleared by the [employer] FDA and has  been authorized for detection and/or diagnosis of SARS-CoV-2 by FDA under an Emergency Use Authorization (EUA). This EUA will remain in effect (meaning this test can be used) for the duration of the COVID-19 declaration under Section 564(b)(1) of the Act, 21 U.S.C. section 360bbb-3(b)(1), unless the authorization is terminated or revoked.  Performed at [facility], [place] [address], [place], [place] [address]   Blood Culture (routine x 2)     Status:  None (Preliminary result)   Collection Time: 07/10/20 12:05 PM   Specimen: BLOOD LEFT HAND  Result Value Ref Range Status   Specimen Description   Final    BLOOD LEFT HAND Performed at [place] [address] [place], [place] [address]    Special Requests   Final    BOTTLES DRAWN AEROBIC AND ANAEROBIC Blood Culture adequate volume Performed at [place] [address], [place], [place] [address]    Culture   Final    NO GROWTH 3 DAYS Performed at [facility], [place] [address], [place], [place] [address]    Report Status PENDING  Incomplete  Blood Culture (routine x 2)     Status: None (Preliminary result)   Collection Time: 07/10/20 12:08 PM   Specimen: BLOOD  Result Value Ref Range Status   Specimen Description   Final    BLOOD RIGHT ANTECUBITAL Performed at [place] [address] [place], [place] [address]    Special Requests   Final    BOTTLES DRAWN AEROBIC AND ANAEROBIC Blood Culture adequate volume Performed at [place] [address] [place], [place] [address]    Culture   Final    NO GROWTH 3 DAYS Performed at [facility], [place] [address] [place], [place] [address]    Report Status PENDING  Incomplete     Labs: BNP (last 3 results) No results for input(s): BNP in the last 8760 hours. Basic Metabolic Panel: Recent Labs  Lab 07/10/20 1206 07/11/20 0420 07/12/20 0529 07/13/20 0454  NA 140 140 138 138  K 2.8* 3.6 3.8 3.7  CL 101 103 104 104  CO2 26 25 23 23   GLUCOSE 99 129* 154* 189*  BUN 9 13 14 19   CREATININE 0.66 0.58 0.52 0.70  CALCIUM 8.8* 8.7* 8.8* 8.5*  MG 2.2  --   --   --    Liver Function Tests: Recent Labs  Lab 07/10/20 1206 07/11/20 0420 07/12/20 0529 07/13/20 0454  AST 26 21 23 20   ALT 23 22 26 27   ALKPHOS 52 48 46 52  BILITOT 0.8 0.7 0.7 0.5  PROT 7.4 6.9 6.8 6.5  ALBUMIN 3.6 3.3* 3.3* 3.2*   No results for input(s):  LIPASE, AMYLASE in the last 168 hours. No results for input(s): AMMONIA in the last 168 hours. CBC: Recent Labs  Lab 07/10/20 1206 07/11/20 0420 07/12/20 0529 07/13/20 0454  WBC 6.0 5.4 7.7 10.4  NEUTROABS 4.3 4.1 6.4 8.6*  HGB 13.5 13.3 14.0 13.0  HCT 40.4 39.6 42.8 39.2  MCV 90.2 90.2 93.0 90.7  PLT 212 241 276 310   Cardiac Enzymes: No results for input(s): CKTOTAL, CKMB, CKMBINDEX, TROPONINI in the last 168 hours. BNP: Invalid input(s): POCBNP CBG: No results for input(s): GLUCAP in the last 168 hours. D-Dimer Recent Labs    07/12/20 0529 07/13/20 0454  DDIMER >20.00* >20.00*   Hgb A1c No results for input(s): HGBA1C in the last 72 hours.  Lipid Profile No results for input(s): CHOL, HDL, LDLCALC, TRIG, CHOLHDL, LDLDIRECT in the last 72 hours. Thyroid function studies No results for input(s): TSH, T4TOTAL, T3FREE, THYROIDAB in the last 72 hours.  Invalid input(s): FREET3 Anemia work up Recent Labs    07/12/20 0529 07/13/20 0454  FERRITIN 413* 348*   Urinalysis    Component Value Date/Time   COLORURINE YELLOW 02/09/2020 1256   APPEARANCEUR CLOUDY (A) 02/09/2020 1256   LABSPEC 1.010 02/09/2020 1256   PHURINE 6.5 02/09/2020 1256   GLUCOSEU NEGATIVE 02/09/2020 1256   HGBUR LARGE (A) 02/09/2020 1256   BILIRUBINUR NEGATIVE 02/09/2020 1256   KETONESUR NEGATIVE 02/09/2020 1256   PROTEINUR NEGATIVE 02/09/2020 1256   NITRITE NEGATIVE 02/09/2020 1256   LEUKOCYTESUR TRACE (A) 02/09/2020 1256   Sepsis Labs Invalid input(s): PROCALCITONIN,  WBC,  LACTICIDVEN Microbiology Recent Results (from the past 240 hour(s))  Resp Panel by RT-PCR (Flu A&B, Covid) Nasopharyngeal Swab     Status: Abnormal   Collection Time: 07/10/20 11:10 AM   Specimen: Nasopharyngeal Swab; Nasopharyngeal(NP) swabs in vial transport medium  Result Value Ref Range Status   SARS Coronavirus 2 by RT PCR POSITIVE (A) NEGATIVE Final    Comment: RESULT CALLED TO, READ BACK BY AND VERIFIED  WITH: DOWD,P. RN @1313  ON 01.03.2022 BY COHEN,K (NOTE) SARS-CoV-2 target nucleic acids are DETECTED.  The SARS-CoV-2 RNA is generally detectable in upper respiratory specimens during the acute phase of infection. Positive results are indicative of the presence of the identified virus, but do not rule out bacterial infection or co-infection with other pathogens not detected by the test. Clinical correlation with patient history and other diagnostic information is necessary to determine patient infection status. The expected result is Negative.  Fact Sheet for Patients: EntrepreneurPulse.com.au  Fact Sheet for Healthcare Providers: IncredibleEmployment.be  This test is not yet approved or cleared by the [place] FDA and  has been authorized for detection and/or diagnosis of SARS-CoV-2 by FDA under an Emergency Use Authorization (EUA).  This EUA will remain in effect (meaning this tes t can be used) for the duration of  the COVID-19 declaration under Section 564(b)(1) of the Act, 21 U.S.C. section 360bbb-3(b)(1), unless the authorization is terminated or revoked sooner.     Influenza A by PCR NEGATIVE NEGATIVE Final   Influenza B by PCR NEGATIVE NEGATIVE Final    Comment: (NOTE) The Xpert Xpress SARS-CoV-2/FLU/RSV plus assay is intended as an aid in the diagnosis of influenza from Nasopharyngeal swab specimens and should not be used as a sole basis for treatment. Nasal washings and aspirates are unacceptable for Xpert Xpress SARS-CoV-2/FLU/RSV testing.  Fact Sheet for Patients: EntrepreneurPulse.com.au  Fact Sheet for Healthcare Providers: IncredibleEmployment.be  This test is not yet approved or cleared by the [place] FDA and has been authorized for detection and/or diagnosis of SARS-CoV-2 by FDA under an Emergency Use Authorization (EUA). This EUA will remain in effect (meaning this test can  be used) for the duration of the COVID-19 declaration under Section 564(b)(1) of the Act, 21 U.S.C. section 360bbb-3(b)(1), unless the authorization is terminated or revoked.  Performed at [facility], [place] [address], [place], [place] [address]   Blood Culture (routine x 2)     Status: None (Preliminary result)   Collection Time: 07/10/20 12:05 PM   Specimen: BLOOD LEFT HAND  Result Value Ref Range Status   Specimen Description   Final    BLOOD LEFT HAND Performed at [place] [facility]  [address], [place], [place] [address]    Special Requests   Final    BOTTLES DRAWN AEROBIC AND ANAEROBIC Blood Culture adequate volume Performed at [place] [address], [place], [place] [address]    Culture   Final    NO GROWTH 3 DAYS Performed at [facility], [place] [address], [place], [place] [address]    Report Status PENDING  Incomplete  Blood Culture (routine x 2)     Status: None (Preliminary result)   Collection Time: 07/10/20 12:08 PM   Specimen: BLOOD  Result Value Ref Range Status   Specimen Description   Final    BLOOD RIGHT ANTECUBITAL Performed at [place] [address] [place], [place] [address]    Special Requests   Final    BOTTLES DRAWN AEROBIC AND ANAEROBIC Blood Culture adequate volume Performed at [facility] [place] [address], [place], [place] [address]    Culture   Final    NO GROWTH 3 DAYS Performed at [facility], [place] [address] [place],  [address]    Report Status PENDING  Incomplete     Time coordinating discharge: Over 33 minutes  SIGNED:   Blain Pais, MD  [facility] 07/13/2020, 3:15 PM Pager   If 7PM-7AM, please contact night-coverage [email or web address] Password [email or web address]

## 2020-07-13 NOTE — Plan of Care (Signed)
  Problem: Clinical Measurements: Goal: Ability to maintain clinical measurements within normal limits will improve Outcome: Adequate for Discharge Goal: Will remain free from infection Outcome: Adequate for Discharge Goal: Diagnostic test results will improve Outcome: Adequate for Discharge   Problem: Activity: Goal: Risk for activity intolerance will decrease Outcome: Adequate for Discharge   Problem: Pain Managment: Goal: General experience of comfort will improve Outcome: Adequate for Discharge   Problem: Safety: Goal: Ability to remain free from injury will improve Outcome: Adequate for Discharge   Problem: Education: Goal: Knowledge of risk factors and measures for prevention of condition will improve Outcome: Adequate for Discharge   Problem: Coping: Goal: Psychosocial and spiritual needs will be supported Outcome: Adequate for Discharge   Problem: Respiratory: Goal: Will maintain a patent airway Outcome: Adequate for Discharge

## 2020-07-13 NOTE — Progress Notes (Addendum)
Pt. Discharge via wheelchair accompanied by RN and NT. Pt. In no acute distress, on RA. No complaints alert and oriented. Discharge education and instructions discussed with patient. Acknowledge understanding. All patient belongings are with family.

## 2020-07-15 LAB — CULTURE, BLOOD (ROUTINE X 2)
Culture: NO GROWTH
Culture: NO GROWTH
Special Requests: ADEQUATE
Special Requests: ADEQUATE

## 2021-07-12 DIAGNOSIS — U071 COVID-19: Secondary | ICD-10-CM | POA: Diagnosis not present

## 2021-09-30 IMAGING — CT CT RENAL STONE PROTOCOL
2 of 4 series · 15 of 46 positions shown, 17 images · non-contrast
Comparison: None.

CLINICAL DATA: 64-year-old female with flank pain. Concern for
kidney stone.

EXAM:
CT ABDOMEN AND PELVIS WITHOUT CONTRAST
TECHNIQUE: Multidetector CT imaging of the abdomen and pelvis was performed
following the standard protocol without IV contrast.

[Series 2: axial st · axial · 0.97mm/px · z∈[-504,-54]mm · 12 of 100 slices shown, 14 images]
[im 5/100  soft-tissue]
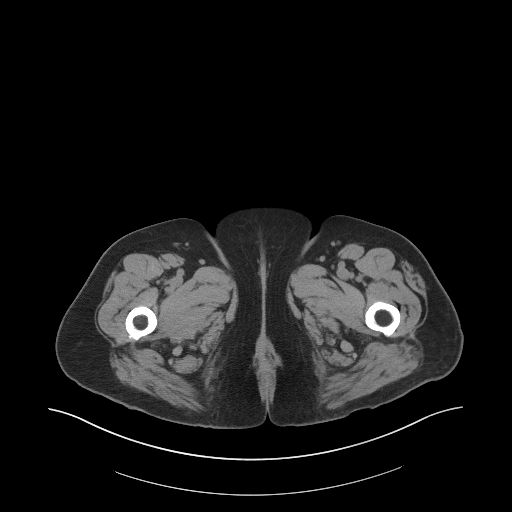
[im 5/100  bone]
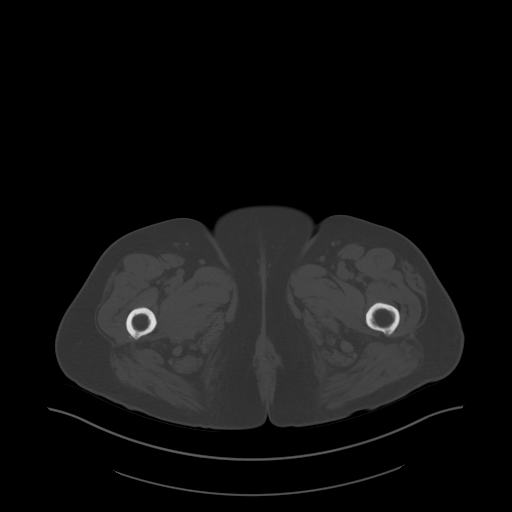
[im 13/100  soft-tissue]
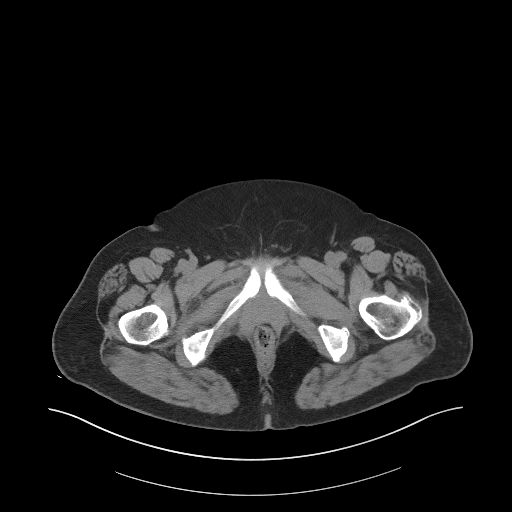
[im 22/100  soft-tissue]
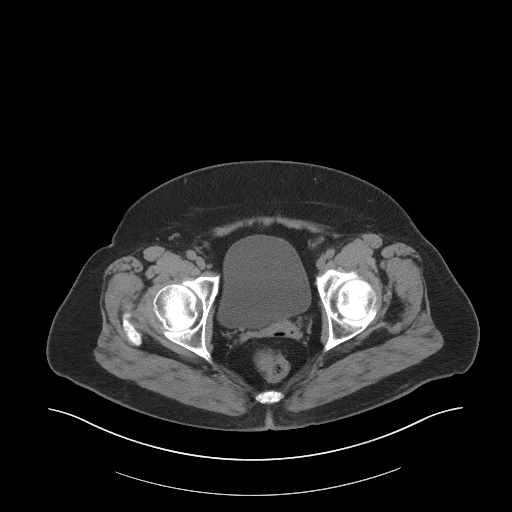
[im 31/100  soft-tissue]
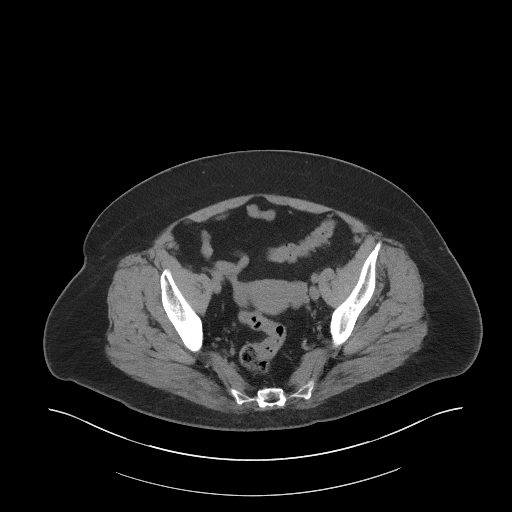
[im 39/100  soft-tissue]
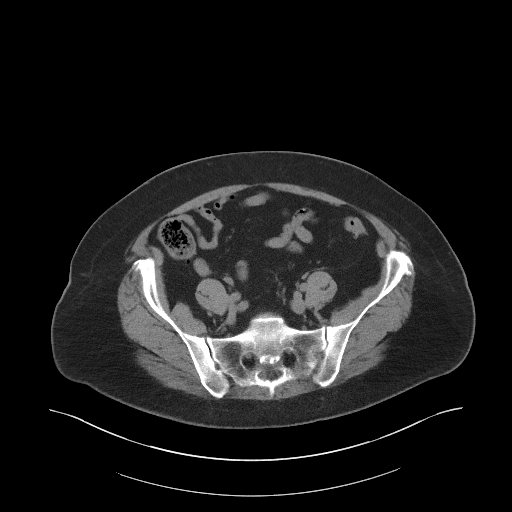
[im 48/100  soft-tissue]
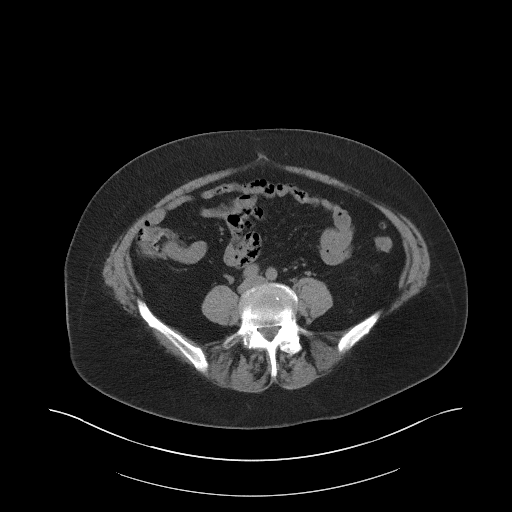
[im 52/100  soft-tissue]
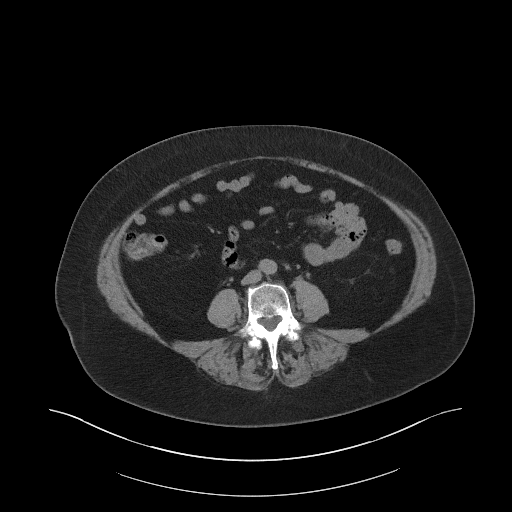
[im 61/100  soft-tissue]
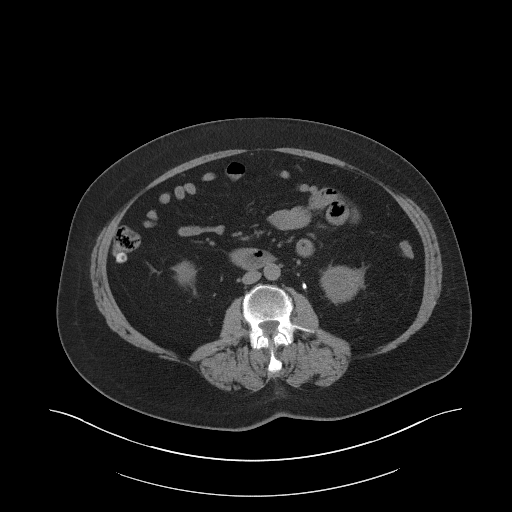
[im 69/100  soft-tissue]
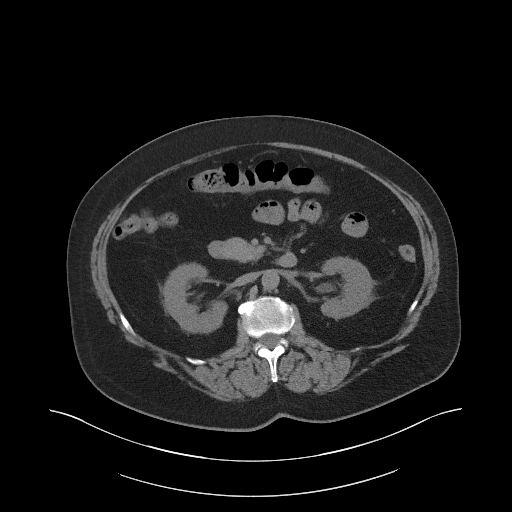
[im 69/100  bone]
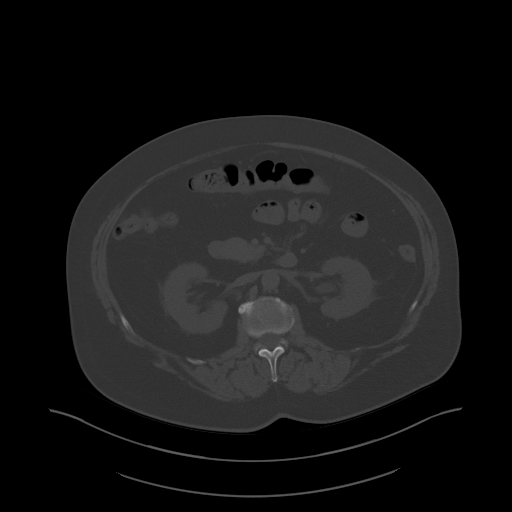
[im 78/100  soft-tissue]
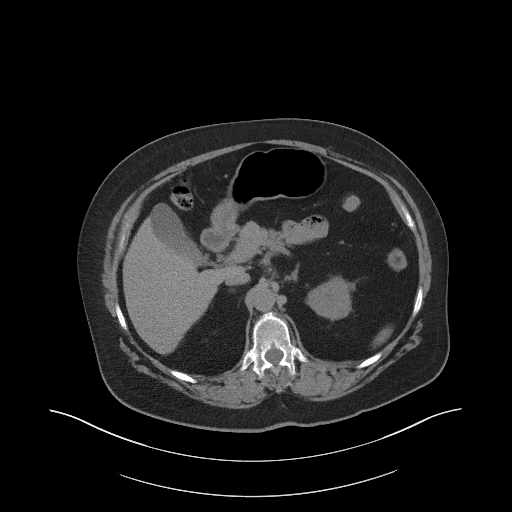
[im 87/100  soft-tissue]
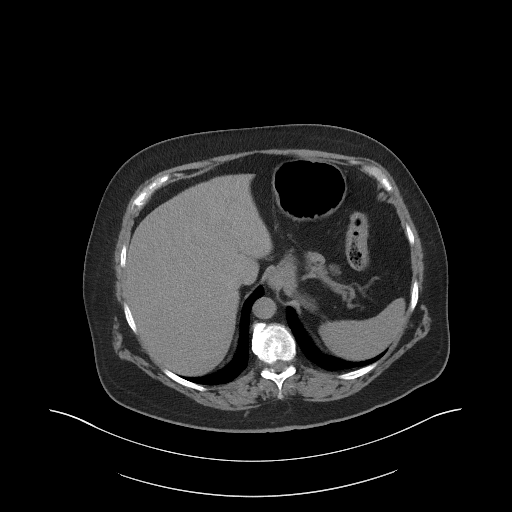
[im 95/100  soft-tissue]
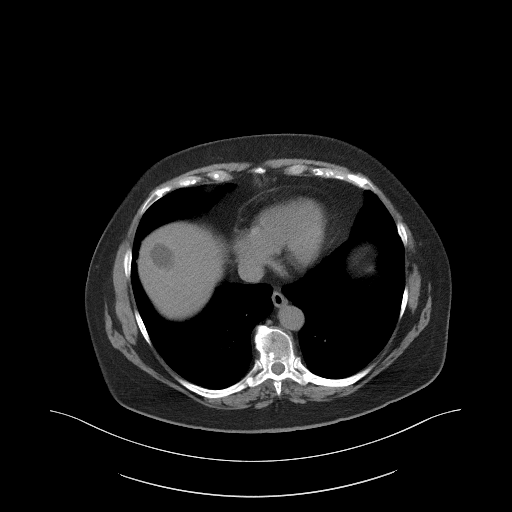

[Series 5: coronal st · coronal · 0.79mm/px · 3 of 105 slices shown]
[im 35/105  soft-tissue]
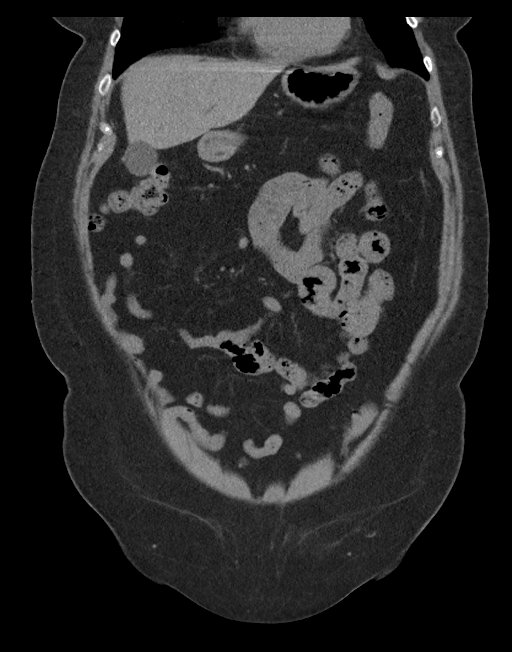
[im 47/105  soft-tissue]
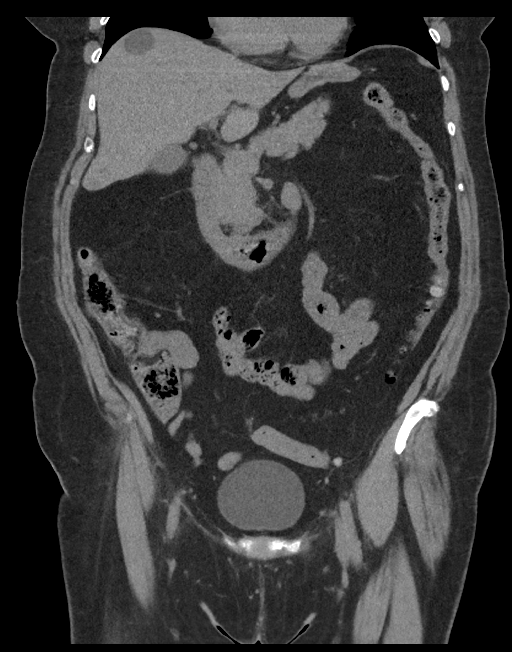
[im 58/105  soft-tissue]
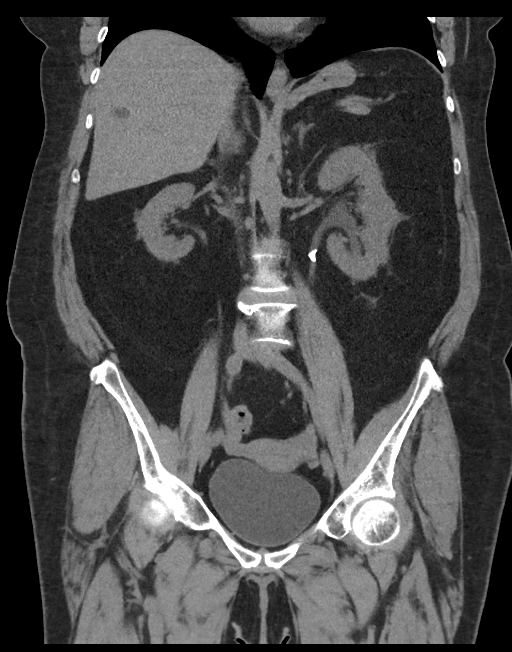

[15 of 46 positions shown; findings below may reference images not displayed]

FINDINGS: Evaluation of this exam is limited in the absence of intravenous
contrast.

Lower chest: The visualized lung bases are clear.

No intra-abdominal free air or free fluid.

Hepatobiliary: Multiple hepatic hypodense lesions are suboptimally
characterized on this noncontrast CT and measure up to approximately
3 cm in the dome of the liver. The larger lesions demonstrate fluid
attenuation consistent with cysts. There is a 3 cm hypodense lesion
in the posterior right lobe of the liver ([DATE]) with a focal area of
peripheral calcification. This lesion demonstrates a slightly higher
attenuation than simple fluid. Further characterization with MRI
without and with contrast on a nonemergent/outpatient basis
recommended. There is no intrahepatic biliary ductal dilatation. The
gallbladder is unremarkable.

Pancreas: Unremarkable. No pancreatic ductal dilatation or
surrounding inflammatory changes.

Spleen: Normal in size without focal abnormality.

Adrenals/Urinary Tract: The adrenal glands unremarkable. There is a
1 cm stone in the proximal left ureter at the ureteropelvic junction
with mild left hydronephrosis. The right kidney, right ureter, and
urinary bladder appear unremarkable.

Stomach/Bowel: There is sigmoid diverticulosis without active
inflammatory changes. There is no bowel obstruction or active
inflammation. The appendix is normal.

Vascular/Lymphatic: The abdominal aorta and IVC unremarkable. No
portal venous gas. There is no adenopathy.

Reproductive: The uterus and ovaries are grossly unremarkable. No
pelvic mass

Other: None

Musculoskeletal: Degenerative changes of spine. No acute osseous
pathology.
IMPRESSION: 1. A 1 cm proximal left ureteral stone with mild left
hydronephrosis.
2. Sigmoid diverticulosis. No bowel obstruction. Normal appendix.
3. Indeterminate hepatic lesions. Further characterization with MRI
without and with contrast on a nonemergent/outpatient basis
recommended.

## 2021-10-19 IMAGING — DX DG ABDOMEN 1V
2 series · 2 of 2 positions shown · non-contrast
Comparison: February 17, 2020 abdominal radiograph and CT abdomen and
pelvis February 09, 2020

CLINICAL DATA: Left-sided ureteral calculus

EXAM:
ABDOMEN - 1 VIEW

[abdomen kub (1 of 2)]
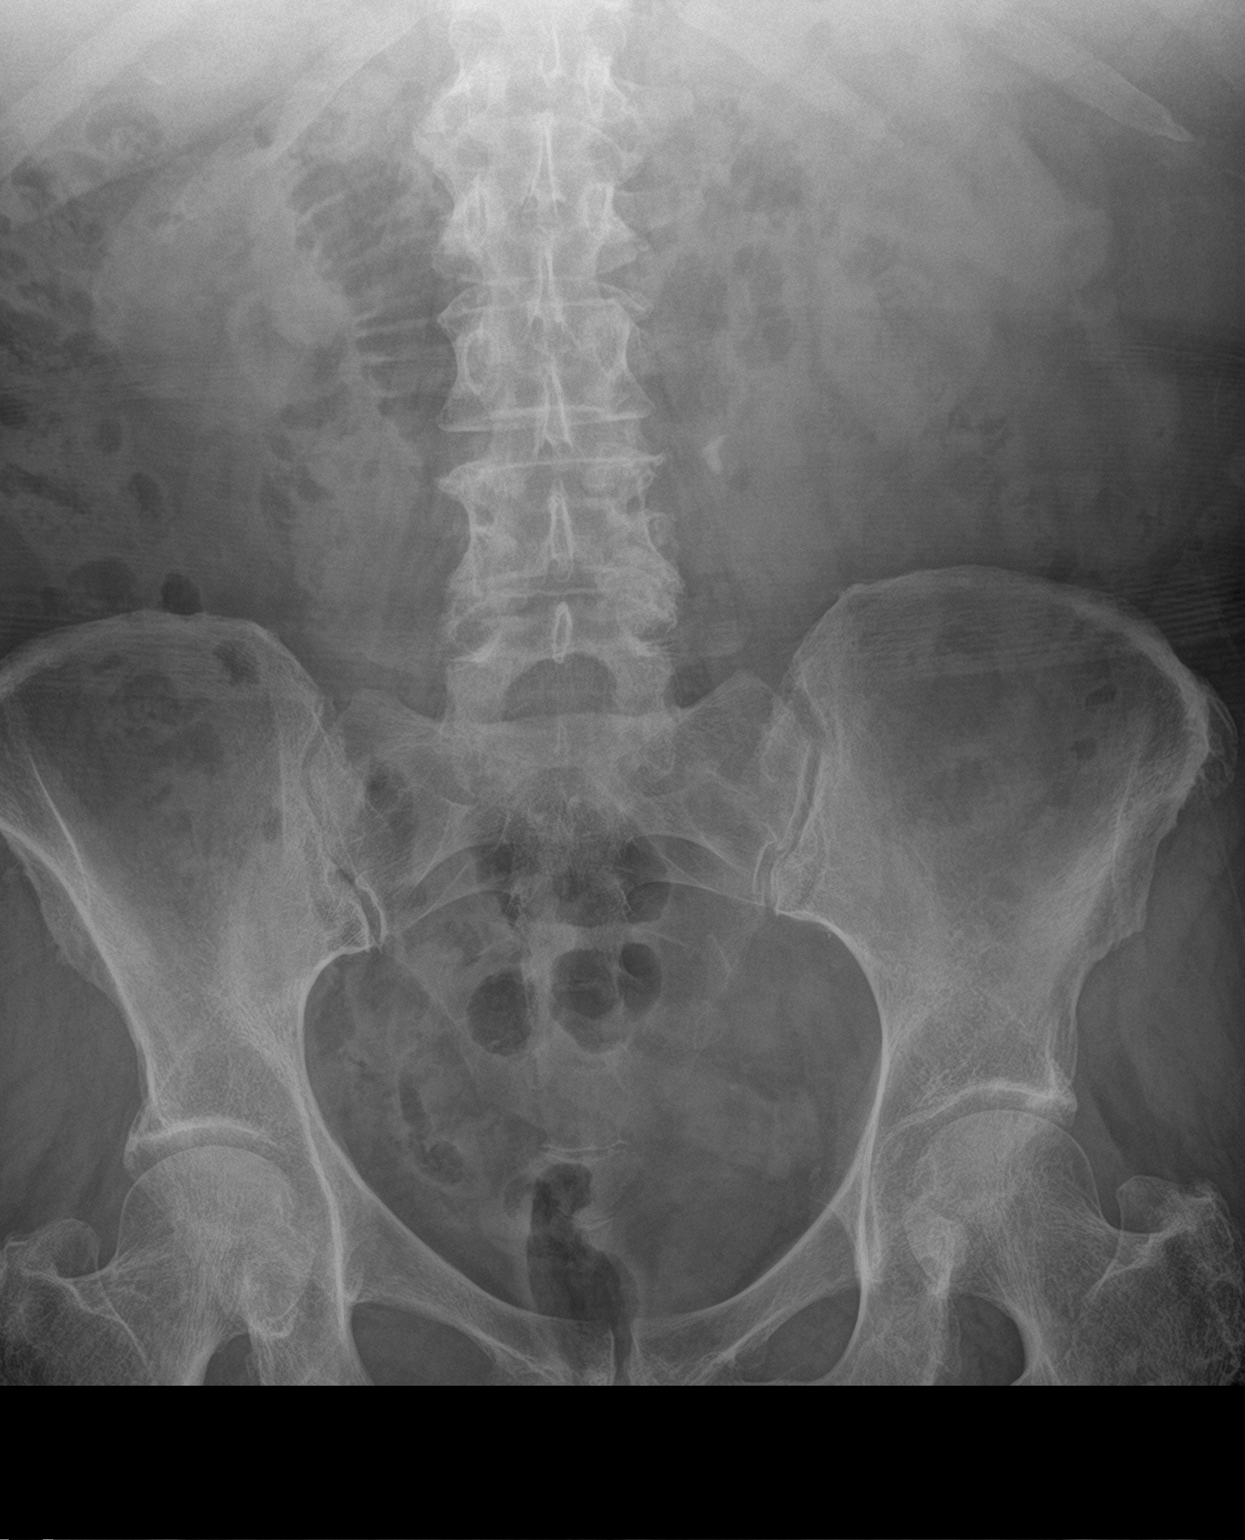

[abdomen kub (2 of 2)]
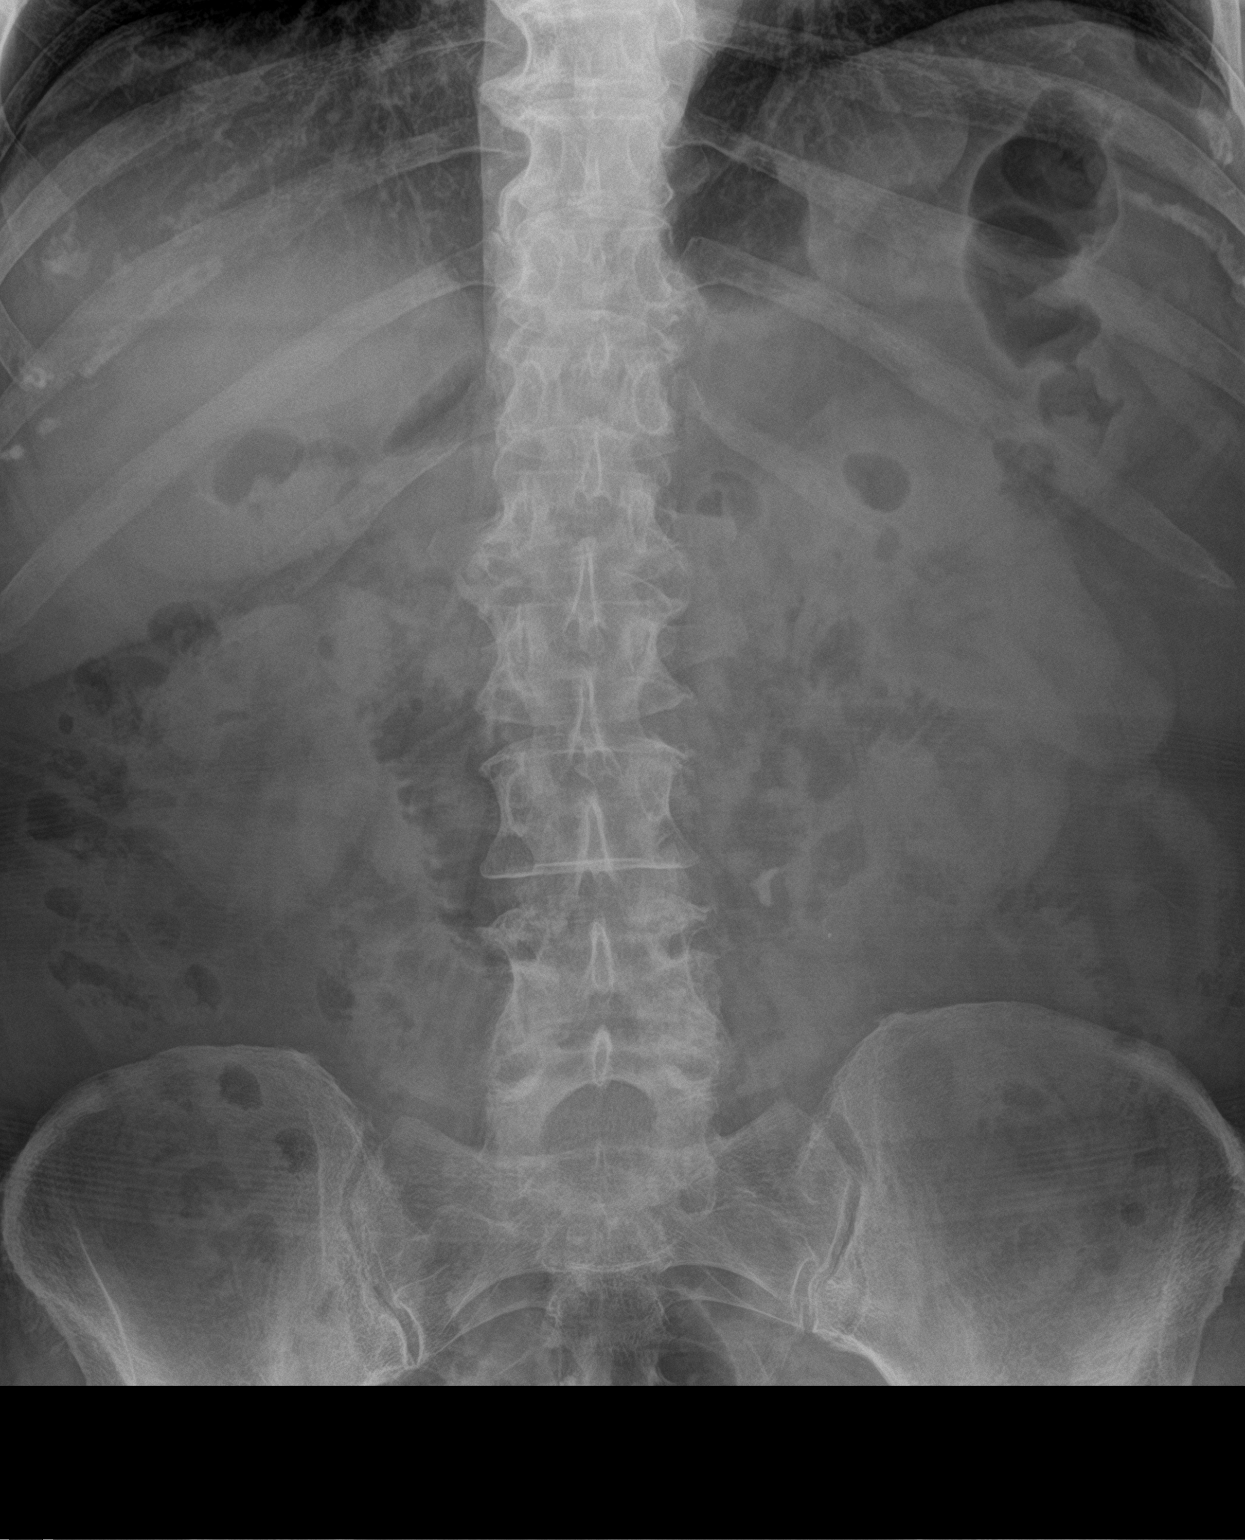

[2 of 2 positions shown; findings below may reference images not displayed]

FINDINGS: There is a persistent calcification at the superior L4 level on the
left measuring 1.0 x 0.5 cm, an apparent ureteral calculus. There
are tiny presumed phleboliths in the pelvis. No new calcifications
are evident. There is moderate stool in the colon. There is no bowel
dilatation or air-fluid level to suggest bowel obstruction. No free
air.
IMPRESSION: 1.0 x 0.5 cm presumed left ureteral calculus at the superior aspect
of L4. Probable tiny phleboliths in the pelvis. No bowel obstruction
or free air.

## 2021-11-15 DIAGNOSIS — H9201 Otalgia, right ear: Secondary | ICD-10-CM | POA: Diagnosis not present

## 2021-11-15 DIAGNOSIS — H6123 Impacted cerumen, bilateral: Secondary | ICD-10-CM | POA: Diagnosis not present

## 2022-01-07 DIAGNOSIS — Z13820 Encounter for screening for osteoporosis: Secondary | ICD-10-CM | POA: Diagnosis not present

## 2022-01-07 DIAGNOSIS — H9201 Otalgia, right ear: Secondary | ICD-10-CM | POA: Diagnosis not present

## 2022-01-07 DIAGNOSIS — Z23 Encounter for immunization: Secondary | ICD-10-CM | POA: Diagnosis not present

## 2022-01-07 DIAGNOSIS — Z1159 Encounter for screening for other viral diseases: Secondary | ICD-10-CM | POA: Diagnosis not present

## 2022-01-07 DIAGNOSIS — I700 Atherosclerosis of aorta: Secondary | ICD-10-CM | POA: Diagnosis not present

## 2022-01-07 DIAGNOSIS — E782 Mixed hyperlipidemia: Secondary | ICD-10-CM | POA: Diagnosis not present

## 2022-01-07 DIAGNOSIS — Z0000 Encounter for general adult medical examination without abnormal findings: Secondary | ICD-10-CM | POA: Diagnosis not present

## 2022-03-01 IMAGING — DX DG CHEST 1V PORT
1 series · 1 of 1 positions shown · non-contrast
Comparison: 04/11/2016.

CLINICAL DATA: dyspnea

EXAM:
PORTABLE CHEST 1 VIEW

[chest ap]
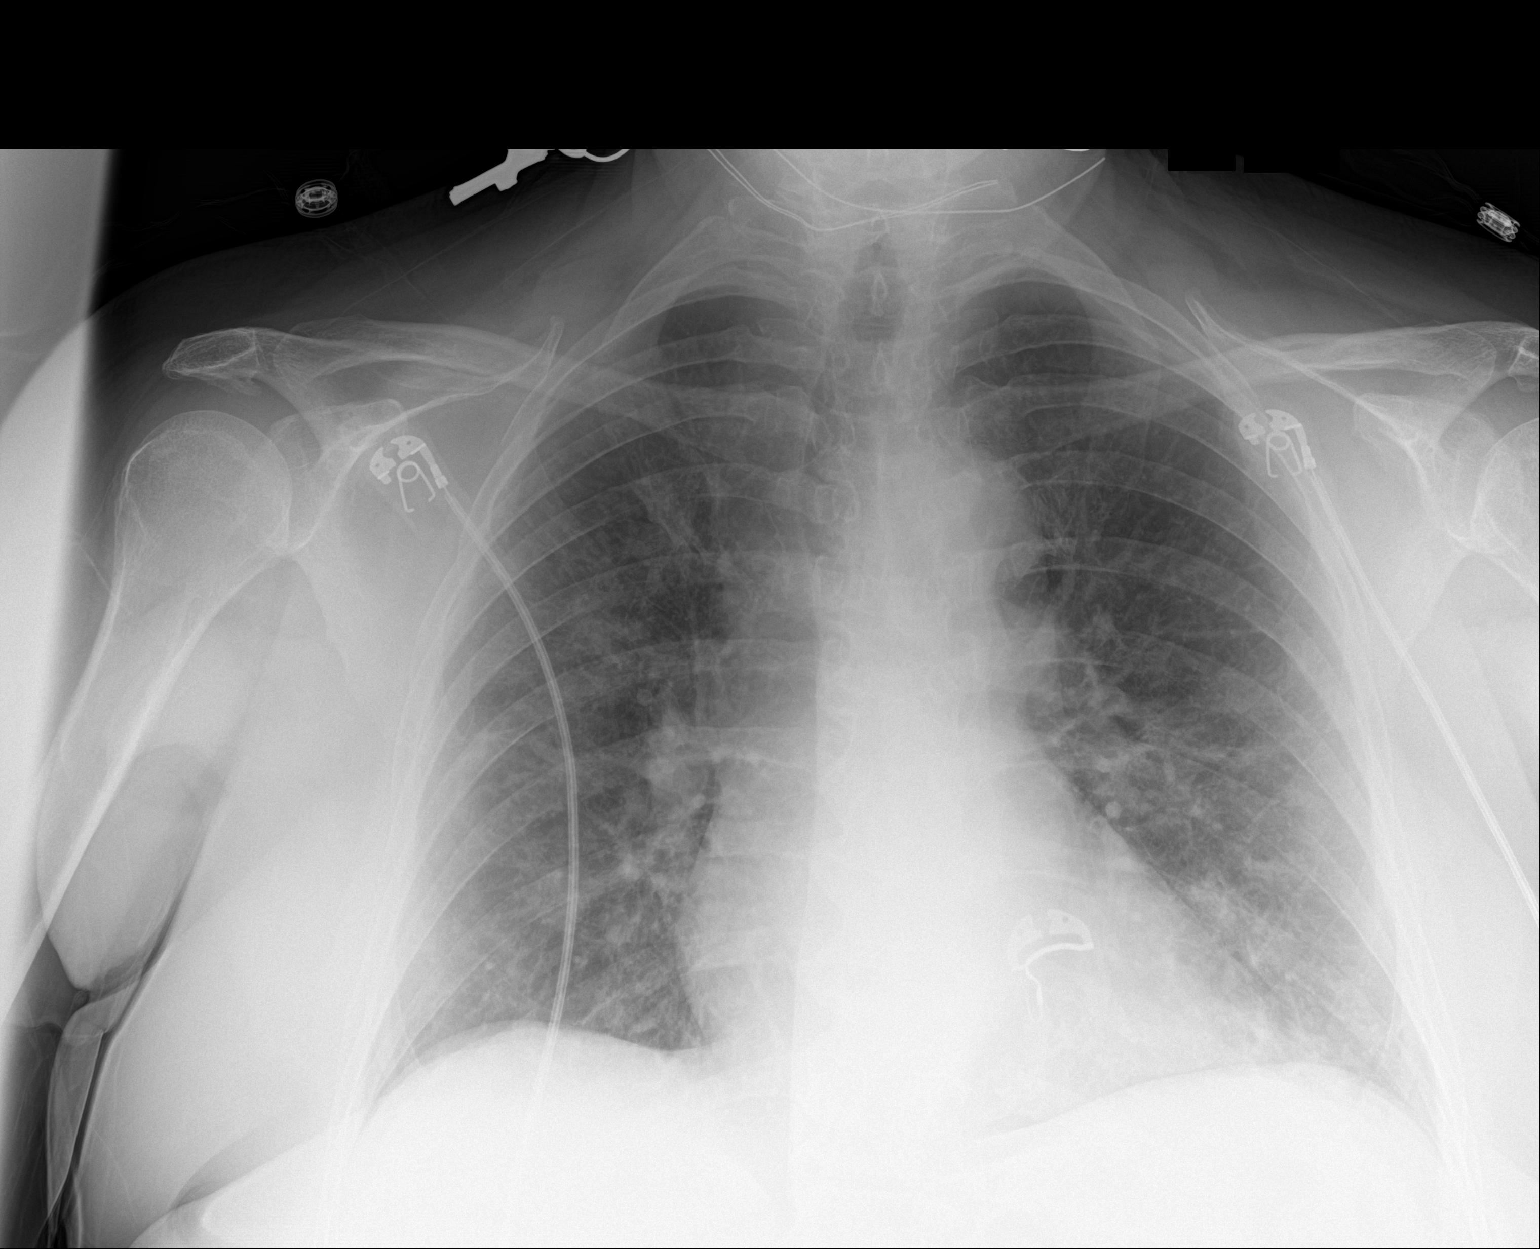

[1 of 1 positions shown; findings below may reference images not displayed]

FINDINGS: No pneumothorax or pleural effusion. Mild hypoinflation. Patchy
bilateral mid to lower lung opacities. Stable cardiomediastinal
silhouette. No acute osseous abnormality.
IMPRESSION: Patchy bilateral pulmonary opacities concerning for infection.

## 2022-03-04 DIAGNOSIS — H5203 Hypermetropia, bilateral: Secondary | ICD-10-CM | POA: Diagnosis not present

## 2022-03-04 DIAGNOSIS — Z135 Encounter for screening for eye and ear disorders: Secondary | ICD-10-CM | POA: Diagnosis not present

## 2022-03-04 DIAGNOSIS — H52223 Regular astigmatism, bilateral: Secondary | ICD-10-CM | POA: Diagnosis not present

## 2022-03-04 DIAGNOSIS — H2513 Age-related nuclear cataract, bilateral: Secondary | ICD-10-CM | POA: Diagnosis not present

## 2022-03-04 DIAGNOSIS — H524 Presbyopia: Secondary | ICD-10-CM | POA: Diagnosis not present

## 2022-03-19 DIAGNOSIS — H93291 Other abnormal auditory perceptions, right ear: Secondary | ICD-10-CM | POA: Diagnosis not present

## 2022-03-19 DIAGNOSIS — H6121 Impacted cerumen, right ear: Secondary | ICD-10-CM | POA: Diagnosis not present

## 2022-03-19 DIAGNOSIS — H608X3 Other otitis externa, bilateral: Secondary | ICD-10-CM | POA: Diagnosis not present

## 2022-05-15 DIAGNOSIS — H90A31 Mixed conductive and sensorineural hearing loss, unilateral, right ear with restricted hearing on the contralateral side: Secondary | ICD-10-CM | POA: Diagnosis not present

## 2022-05-15 DIAGNOSIS — H90A22 Sensorineural hearing loss, unilateral, left ear, with restricted hearing on the contralateral side: Secondary | ICD-10-CM | POA: Diagnosis not present

## 2022-06-14 DIAGNOSIS — M85851 Other specified disorders of bone density and structure, right thigh: Secondary | ICD-10-CM | POA: Diagnosis not present

## 2022-06-14 DIAGNOSIS — Z1231 Encounter for screening mammogram for malignant neoplasm of breast: Secondary | ICD-10-CM | POA: Diagnosis not present

## 2022-06-14 DIAGNOSIS — M85852 Other specified disorders of bone density and structure, left thigh: Secondary | ICD-10-CM | POA: Diagnosis not present

## 2022-06-14 DIAGNOSIS — Z780 Asymptomatic menopausal state: Secondary | ICD-10-CM | POA: Diagnosis not present

## 2022-12-16 DIAGNOSIS — L400 Psoriasis vulgaris: Secondary | ICD-10-CM | POA: Diagnosis not present

## 2023-01-14 DIAGNOSIS — E782 Mixed hyperlipidemia: Secondary | ICD-10-CM | POA: Diagnosis not present

## 2023-01-14 DIAGNOSIS — Z1211 Encounter for screening for malignant neoplasm of colon: Secondary | ICD-10-CM | POA: Diagnosis not present

## 2023-01-14 DIAGNOSIS — I700 Atherosclerosis of aorta: Secondary | ICD-10-CM | POA: Diagnosis not present

## 2023-01-14 DIAGNOSIS — Z0000 Encounter for general adult medical examination without abnormal findings: Secondary | ICD-10-CM | POA: Diagnosis not present

## 2023-01-14 DIAGNOSIS — E039 Hypothyroidism, unspecified: Secondary | ICD-10-CM | POA: Diagnosis not present

## 2023-04-07 DIAGNOSIS — H2513 Age-related nuclear cataract, bilateral: Secondary | ICD-10-CM | POA: Diagnosis not present

## 2023-04-07 DIAGNOSIS — H04123 Dry eye syndrome of bilateral lacrimal glands: Secondary | ICD-10-CM | POA: Diagnosis not present

## 2023-04-07 DIAGNOSIS — H524 Presbyopia: Secondary | ICD-10-CM | POA: Diagnosis not present

## 2023-04-07 DIAGNOSIS — H52223 Regular astigmatism, bilateral: Secondary | ICD-10-CM | POA: Diagnosis not present

## 2023-04-07 DIAGNOSIS — H5203 Hypermetropia, bilateral: Secondary | ICD-10-CM | POA: Diagnosis not present

## 2023-04-07 DIAGNOSIS — Z135 Encounter for screening for eye and ear disorders: Secondary | ICD-10-CM | POA: Diagnosis not present

## 2023-05-01 DIAGNOSIS — Z860109 Personal history of other colon polyps: Secondary | ICD-10-CM | POA: Diagnosis not present

## 2023-05-01 DIAGNOSIS — Z860100 Personal history of colon polyps, unspecified: Secondary | ICD-10-CM | POA: Diagnosis not present

## 2023-05-01 DIAGNOSIS — Z1211 Encounter for screening for malignant neoplasm of colon: Secondary | ICD-10-CM | POA: Diagnosis not present

## 2023-05-21 DIAGNOSIS — Z23 Encounter for immunization: Secondary | ICD-10-CM | POA: Diagnosis not present

## 2023-07-03 DIAGNOSIS — Z1231 Encounter for screening mammogram for malignant neoplasm of breast: Secondary | ICD-10-CM | POA: Diagnosis not present

## 2023-07-07 DIAGNOSIS — M722 Plantar fascial fibromatosis: Secondary | ICD-10-CM | POA: Diagnosis not present

## 2023-07-07 DIAGNOSIS — M71572 Other bursitis, not elsewhere classified, left ankle and foot: Secondary | ICD-10-CM | POA: Diagnosis not present

## 2023-07-07 DIAGNOSIS — M7732 Calcaneal spur, left foot: Secondary | ICD-10-CM | POA: Diagnosis not present

## 2023-07-07 DIAGNOSIS — M79672 Pain in left foot: Secondary | ICD-10-CM | POA: Diagnosis not present

## 2023-07-07 DIAGNOSIS — M7662 Achilles tendinitis, left leg: Secondary | ICD-10-CM | POA: Diagnosis not present

## 2023-10-14 DIAGNOSIS — M7662 Achilles tendinitis, left leg: Secondary | ICD-10-CM | POA: Diagnosis not present

## 2023-10-14 DIAGNOSIS — M71572 Other bursitis, not elsewhere classified, left ankle and foot: Secondary | ICD-10-CM | POA: Diagnosis not present

## 2023-11-21 DIAGNOSIS — S86012A Strain of left Achilles tendon, initial encounter: Secondary | ICD-10-CM | POA: Diagnosis not present

## 2023-11-23 DIAGNOSIS — M25572 Pain in left ankle and joints of left foot: Secondary | ICD-10-CM | POA: Diagnosis not present

## 2023-11-28 ENCOUNTER — Encounter (HOSPITAL_BASED_OUTPATIENT_CLINIC_OR_DEPARTMENT_OTHER): Payer: Self-pay | Admitting: Orthopaedic Surgery

## 2023-11-28 ENCOUNTER — Other Ambulatory Visit: Payer: Self-pay

## 2023-11-28 DIAGNOSIS — S86012A Strain of left Achilles tendon, initial encounter: Secondary | ICD-10-CM | POA: Diagnosis not present

## 2023-11-29 NOTE — Discharge Instructions (Signed)
 Netta Cedars, MD [facility]  Please read the following information regarding your care after surgery.  Medications  You only need a prescription for the narcotic pain medicine (ex. oxycodone, Percocet, Norco).  All of the other medicines listed below are available over the counter. ? Aleve 2 pills twice a day for the first 3 days after surgery. ? acetominophen (Tylenol) 650 mg every 4-6 hours as you need for minor to moderate pain ? oxycodone as prescribed for severe pain  ? To help prevent blood clots, take aspirin (81 mg) twice daily for at least 42 days after surgery.  You should also get up every hour while you are awake to move around.  Weight Bearing ? Do NOT bear any weight on the operated leg or foot. This means do NOT touch your surgical leg to the ground!  Cast / Splint / Dressing ? If you have a splint, do NOT remove this. Keep your splint, cast or dressing clean and dry.  Don't put anything (coat hanger, pencil, etc) down inside of it.  If it gets wet, call the office immediately to schedule an appointment for a cast change.  Swelling IMPORTANT: It is normal for you to have swelling where you had surgery. To reduce swelling and pain, keep at least 3 pillows under your leg so that your toes are above your nose and your heel is above the level of your hip.  It may be necessary to keep your foot or leg elevated for several weeks.  This is critical to helping your incisions heal and your pain to feel better.  Follow Up Call my office at [phone] when you are discharged from the hospital or surgery center to schedule an appointment to be seen 7-10 days after surgery.  Call my office at [phone] if you develop a fever >101.5 F, nausea, vomiting, bleeding from the surgical site or severe pain.     Post Anesthesia Home Care Instructions  Activity: Get plenty of rest for the remainder of the day. A responsible individual must stay with you for 24 hours following the  procedure.  For the next 24 hours, DO NOT: -Drive a car -Advertising copywriter -Drink alcoholic beverages -Take any medication unless instructed by your physician -Make any legal decisions or sign important papers.  Meals: Start with liquid foods such as gelatin or soup. Progress to regular foods as tolerated. Avoid greasy, spicy, heavy foods. If nausea and/or vomiting occur, drink only clear liquids until the nausea and/or vomiting subsides. Call your physician if vomiting continues.  Special Instructions/Symptoms: Your throat may feel dry or sore from the anesthesia or the breathing tube placed in your throat during surgery. If this causes discomfort, gargle with warm salt water. The discomfort should disappear within 24 hours.  If you had a scopolamine patch placed behind your ear for the management of post- operative nausea and/or vomiting:  1. The medication in the patch is effective for 72 hours, after which it should be removed.  Wrap patch in a tissue and discard in the trash. Wash hands thoroughly with soap and water. 2. You may remove the patch earlier than 72 hours if you experience unpleasant side effects which may include dry mouth, dizziness or visual disturbances. 3. Avoid touching the patch. Wash your hands with soap and water after contact with the patch.  Regional Anesthesia Blocks  1. You may not be able to move or feel the "blocked" extremity after a regional anesthetic block. This may last may last  from 3-48 hours after placement, but it will go away. The length of time depends on the medication injected and your individual response to the medication. As the nerves start to wake up, you may experience tingling as the movement and feeling returns to your extremity. If the numbness and inability to move your extremity has not gone away after 48 hours, please call your surgeon.   2. The extremity that is blocked will need to be protected until the numbness is gone and the  strength has returned. Because you cannot feel it, you will need to take extra care to avoid injury. Because it may be weak, you may have difficulty moving it or using it. You may not know what position it is in without looking at it while the block is in effect.  3. For blocks in the legs and feet, returning to weight bearing and walking needs to be done carefully. You will need to wait until the numbness is entirely gone and the strength has returned. You should be able to move your leg and foot normally before you try and bear weight or walk. You will need someone to be with you when you first try to ensure you do not fall and possibly risk injury.  4. Bruising and tenderness at the needle site are common side effects and will resolve in a few days.  5. Persistent numbness or new problems with movement should be communicated to the surgeon or the [facility] [phone] [facility] [phone]).

## 2023-11-29 NOTE — H&P (Signed)
 ORTHOPAEDIC SURGERY H&P  Subjective:  The patient presents with left Achilles rupture.   Past Medical History:  Diagnosis Date   History of kidney stones    Ruptured, tendon, Achilles, left, initial encounter     Past Surgical History:  Procedure Laterality Date   COLONOSCOPY     EXTRACORPOREAL SHOCK WAVE LITHOTRIPSY Left 02/28/2020   Procedure: LEFT EXTRACORPOREAL SHOCK WAVE LITHOTRIPSY (ESWL);  Surgeon: Trent Frizzle, MD;  Location: [facility];  Service: Urology;  Laterality: Left;     (Not in an outpatient encounter)    No Known Allergies  Social History   Socioeconomic History   Marital status: Married    Spouse name: Not on file   Number of children: Not on file   Years of education: Not on file   Highest education level: Not on file  Occupational History   Not on file  Tobacco Use   Smoking status: Never   Smokeless tobacco: Never  Substance and Sexual Activity   Alcohol use: Yes    Comment: rare   Drug use: Never   Sexual activity: Yes    Birth control/protection: Post-menopausal  Other Topics Concern   Not on file  Social History Narrative   Not on file   Social Drivers of Health   Financial Resource Strain: Not on file  Food Insecurity: Not on file  Transportation Needs: Not on file  Physical Activity: Not on file  Stress: Not on file  Social Connections: Not on file  Intimate Partner Violence: Not on file     History reviewed. No pertinent family history.   Review of Systems Pertinent items are noted in HPI.  Objective: Vital signs in last 24 hours:    11/28/2023    4:20 PM 07/13/2020    2:21 PM 07/13/2020    5:56 AM  Vitals with BMI  Height 5\' 4"     Weight 174 lbs    BMI 29.85    Systolic  123 135  Diastolic  71 85  Pulse  78 70      EXAM: General: Well nourished, well developed. Awake, alert and oriented to time, place, person. Normal mood and affect. No apparent distress. Breathing room air.  Operative  Lower Extremity: Alignment - Neutral Deformity - None Skin intact Tenderness to palpation - None 0/5 TA, 5/5 PT, GS, Per, EHL, FHL Sensation intact to light touch throughout Palpable DP and PT pulses Special testing: None  The contralateral foot/ankle was examined for comparison and noted to be neurovascularly intact with no localized deformity, swelling, or tenderness.  Imaging Review All images taken were independently reviewed by me.  Assessment/Plan: The clinical and radiographic findings were reviewed and discussed at length with the patient.  The patient has left Achilles rupture.  We spoke at length about the natural course of these findings. We discussed nonoperative and operative treatment options in detail.  The risks and benefits were presented and reviewed. The risks due to tendon rerupture, tendon transfer, need for future insertional debridement and/or tendon reconstruction, hardware failure/irritation, new/persistent/recurrent infection, stiffness, nerve/vessel/tendon injury, nonunion/malunion of any fracture, wound healing issues, allograft usage, development of arthritis, failure of this surgery, possibility of external fixation in certain situations, possibility of delayed definitive surgery, need for further surgery, prolonged wound care including further soft tissue coverage procedures, thromboembolic events, anesthesia/medical complications/events perioperatively and beyond, amputation, death among others were discussed. The patient acknowledged the explanation and agreed to proceed with the plan.  Suzanne Collins  Orthopaedic Surgery  [facility]

## 2023-12-03 ENCOUNTER — Ambulatory Visit (HOSPITAL_BASED_OUTPATIENT_CLINIC_OR_DEPARTMENT_OTHER)
Admission: RE | Admit: 2023-12-03 | Discharge: 2023-12-03 | Disposition: A | Discharge status: Home / Self Care | Attending: Orthopaedic Surgery | Admitting: Orthopaedic Surgery

## 2023-12-03 ENCOUNTER — Other Ambulatory Visit: Payer: Self-pay

## 2023-12-03 ENCOUNTER — Ambulatory Visit (HOSPITAL_BASED_OUTPATIENT_CLINIC_OR_DEPARTMENT_OTHER): Payer: Self-pay | Admitting: Anesthesiology

## 2023-12-03 ENCOUNTER — Encounter (HOSPITAL_BASED_OUTPATIENT_CLINIC_OR_DEPARTMENT_OTHER): Payer: Self-pay | Admitting: Orthopaedic Surgery

## 2023-12-03 ENCOUNTER — Encounter (HOSPITAL_BASED_OUTPATIENT_CLINIC_OR_DEPARTMENT_OTHER)
Admission: RE | Disposition: A | Discharge status: Home / Self Care | Payer: Self-pay | Source: Home / Self Care | Attending: Orthopaedic Surgery

## 2023-12-03 DIAGNOSIS — F419 Anxiety disorder, unspecified: Secondary | ICD-10-CM | POA: Diagnosis not present

## 2023-12-03 DIAGNOSIS — S86012A Strain of left Achilles tendon, initial encounter: Secondary | ICD-10-CM | POA: Insufficient documentation

## 2023-12-03 DIAGNOSIS — G8918 Other acute postprocedural pain: Secondary | ICD-10-CM | POA: Diagnosis not present

## 2023-12-03 DIAGNOSIS — X58XXXA Exposure to other specified factors, initial encounter: Secondary | ICD-10-CM | POA: Insufficient documentation

## 2023-12-03 DIAGNOSIS — Z01818 Encounter for other preprocedural examination: Secondary | ICD-10-CM

## 2023-12-03 DIAGNOSIS — M66872 Spontaneous rupture of other tendons, left ankle and foot: Secondary | ICD-10-CM | POA: Diagnosis not present

## 2023-12-03 HISTORY — DX: Strain of left Achilles tendon, initial encounter: S86.012A

## 2023-12-03 HISTORY — DX: Personal history of urinary calculi: Z87.442

## 2023-12-03 HISTORY — PX: ACHILLES TENDON SURGERY: SHX542

## 2023-12-03 LAB — GLUCOSE, CAPILLARY: Glucose-Capillary: 103 mg/dL — ABNORMAL HIGH (ref 70–99)

## 2023-12-03 SURGERY — REPAIR, TENDON, ACHILLES
Anesthesia: General | Laterality: Left

## 2023-12-03 MED ORDER — FENTANYL CITRATE (PF) 100 MCG/2ML IJ SOLN
INTRAMUSCULAR | Status: AC
  Filled 2023-12-03: qty 2

## 2023-12-03 MED ORDER — ROCURONIUM BROMIDE 10 MG/ML (PF) SYRINGE
PREFILLED_SYRINGE | INTRAVENOUS | Status: AC
  Filled 2023-12-03: qty 10

## 2023-12-03 MED ORDER — LACTATED RINGERS IV SOLN: INTRAVENOUS | Status: DC | PRN

## 2023-12-03 MED ORDER — VANCOMYCIN HCL 500 MG IV SOLR
INTRAVENOUS | Status: DC | PRN
  Administered 2023-12-03: 500 mg via TOPICAL

## 2023-12-03 MED ORDER — EPHEDRINE 5 MG/ML INJ
INTRAVENOUS | Status: AC
  Filled 2023-12-03: qty 5

## 2023-12-03 MED ORDER — PHENYLEPHRINE HCL (PRESSORS) 10 MG/ML IV SOLN
INTRAVENOUS | Status: DC | PRN
  Administered 2023-12-03 (×2): 160 ug via INTRAVENOUS

## 2023-12-03 MED ORDER — EPHEDRINE SULFATE (PRESSORS) 50 MG/ML IJ SOLN
INTRAMUSCULAR | Status: DC | PRN
  Administered 2023-12-03: 5 mg via INTRAVENOUS

## 2023-12-03 MED ORDER — SUGAMMADEX SODIUM 200 MG/2ML IV SOLN
INTRAVENOUS | Status: DC | PRN
  Administered 2023-12-03: 400 mg via INTRAVENOUS

## 2023-12-03 MED ORDER — CHLORHEXIDINE GLUCONATE 4 % EX SOLN: 60.0000 mL | Freq: Once | CUTANEOUS | Status: DC

## 2023-12-03 MED ORDER — MIDAZOLAM HCL 2 MG/2ML IJ SOLN
2.0000 mg | Freq: Once | INTRAMUSCULAR | Status: AC
  Administered 2023-12-03: 2 mg via INTRAVENOUS

## 2023-12-03 MED ORDER — FENTANYL CITRATE (PF) 100 MCG/2ML IJ SOLN: 25.0000 ug | INTRAMUSCULAR | Status: DC | PRN

## 2023-12-03 MED ORDER — CLONIDINE HCL (ANALGESIA) 100 MCG/ML EP SOLN
EPIDURAL | Status: DC | PRN
Start: 2023-12-03 — End: 2023-12-03
  Administered 2023-12-03: 50 ug

## 2023-12-03 MED ORDER — ACETAMINOPHEN 500 MG PO TABS
ORAL_TABLET | ORAL | Status: AC
  Filled 2023-12-03: qty 2

## 2023-12-03 MED ORDER — ROCURONIUM BROMIDE 100 MG/10ML IV SOLN
INTRAVENOUS | Status: DC | PRN
Start: 2023-12-03 — End: 2023-12-03
  Administered 2023-12-03: 60 mg via INTRAVENOUS

## 2023-12-03 MED ORDER — POVIDONE-IODINE 10 % EX SOLN
CUTANEOUS | Status: DC | PRN
  Administered 2023-12-03: 1 via TOPICAL

## 2023-12-03 MED ORDER — DEXAMETHASONE SODIUM PHOSPHATE 10 MG/ML IJ SOLN
INTRAMUSCULAR | Status: DC | PRN
Start: 2023-12-03 — End: 2023-12-03
  Administered 2023-12-03: 6 mg via INTRAVENOUS

## 2023-12-03 MED ORDER — FENTANYL CITRATE (PF) 100 MCG/2ML IJ SOLN
100.0000 ug | Freq: Once | INTRAMUSCULAR | Status: AC
  Administered 2023-12-03: 50 ug via INTRAVENOUS

## 2023-12-03 MED ORDER — ONDANSETRON HCL 4 MG/2ML IJ SOLN: 4.0000 mg | Freq: Once | INTRAMUSCULAR | Status: DC | PRN

## 2023-12-03 MED ORDER — PROPOFOL 10 MG/ML IV BOLUS
INTRAVENOUS | Status: DC | PRN
  Administered 2023-12-03: 150 mg via INTRAVENOUS

## 2023-12-03 MED ORDER — 0.9 % SODIUM CHLORIDE (POUR BTL) OPTIME
TOPICAL | Status: DC | PRN
Start: 2023-12-03 — End: 2023-12-03
  Administered 2023-12-03: 1000 mL

## 2023-12-03 MED ORDER — PHENYLEPHRINE 80 MCG/ML (10ML) SYRINGE FOR IV PUSH (FOR BLOOD PRESSURE SUPPORT)
PREFILLED_SYRINGE | INTRAVENOUS | Status: AC
  Filled 2023-12-03: qty 10

## 2023-12-03 MED ORDER — KETOROLAC TROMETHAMINE 15 MG/ML IJ SOLN
INTRAMUSCULAR | Status: DC | PRN
  Administered 2023-12-03: 15 mg via INTRAVENOUS

## 2023-12-03 MED ORDER — ACETAMINOPHEN 500 MG PO TABS
1000.0000 mg | ORAL_TABLET | Freq: Once | ORAL | Status: AC
  Administered 2023-12-03: 1000 mg via ORAL

## 2023-12-03 MED ORDER — MIDAZOLAM HCL 2 MG/2ML IJ SOLN
INTRAMUSCULAR | Status: AC
Start: 2023-12-03 — End: ?
  Filled 2023-12-03: qty 2

## 2023-12-03 MED ORDER — DEXMEDETOMIDINE HCL IN NACL 80 MCG/20ML IV SOLN
INTRAVENOUS | Status: DC | PRN
  Administered 2023-12-03: 4 ug via INTRAVENOUS

## 2023-12-03 MED ORDER — BUPIVACAINE-EPINEPHRINE (PF) 0.5% -1:200000 IJ SOLN
INTRAMUSCULAR | Status: DC | PRN
Start: 2023-12-03 — End: 2023-12-03
  Administered 2023-12-03: 30 mL via PERINEURAL
  Administered 2023-12-03: 10 mL via PERINEURAL

## 2023-12-03 MED ORDER — FENTANYL CITRATE (PF) 100 MCG/2ML IJ SOLN
INTRAMUSCULAR | Status: AC
Start: 2023-12-03 — End: ?
  Filled 2023-12-03: qty 2

## 2023-12-03 MED ORDER — FENTANYL CITRATE (PF) 100 MCG/2ML IJ SOLN
INTRAMUSCULAR | Status: DC | PRN
Start: 2023-12-03 — End: 2023-12-03
  Administered 2023-12-03 (×2): 25 ug via INTRAVENOUS

## 2023-12-03 MED ORDER — GLYCOPYRROLATE PF 0.2 MG/ML IJ SOSY
PREFILLED_SYRINGE | INTRAMUSCULAR | Status: AC
Start: 2023-12-03 — End: ?
  Filled 2023-12-03: qty 1

## 2023-12-03 MED ORDER — GLYCOPYRROLATE 0.2 MG/ML IJ SOLN
INTRAMUSCULAR | Status: DC | PRN
  Administered 2023-12-03: .2 mg via INTRAVENOUS

## 2023-12-03 MED ORDER — ONDANSETRON HCL 4 MG/2ML IJ SOLN
INTRAMUSCULAR | Status: AC
  Filled 2023-12-03: qty 2

## 2023-12-03 MED ORDER — CEFAZOLIN SODIUM-DEXTROSE 2-4 GM/100ML-% IV SOLN
2.0000 g | INTRAVENOUS | Status: AC
  Administered 2023-12-03: 2 g via INTRAVENOUS

## 2023-12-03 MED ORDER — CEFAZOLIN SODIUM-DEXTROSE 2-4 GM/100ML-% IV SOLN
INTRAVENOUS | Status: AC
  Filled 2023-12-03: qty 100

## 2023-12-03 MED ORDER — LACTATED RINGERS IV SOLN: INTRAVENOUS | Status: DC

## 2023-12-03 MED ORDER — LIDOCAINE 2% (20 MG/ML) 5 ML SYRINGE
INTRAMUSCULAR | Status: DC | PRN
  Administered 2023-12-03: 80 mg via INTRAVENOUS

## 2023-12-03 MED ORDER — ONDANSETRON HCL 4 MG/2ML IJ SOLN
INTRAMUSCULAR | Status: DC | PRN
Start: 2023-12-03 — End: 2023-12-03
  Administered 2023-12-03: 4 mg via INTRAVENOUS

## 2023-12-03 MED ORDER — DEXAMETHASONE SODIUM PHOSPHATE 10 MG/ML IJ SOLN
INTRAMUSCULAR | Status: AC
  Filled 2023-12-03: qty 1

## 2023-12-03 SURGICAL SUPPLY — 51 items
BANDAGE ESMARK 6X9 LF (GAUZE/BANDAGES/DRESSINGS) IMPLANT
BLADE AVERAGE 25X9 (BLADE) IMPLANT
BLADE MICRO SAGITTAL (BLADE) IMPLANT
BLADE SURG 15 STRL LF DISP TIS (BLADE) ×2 IMPLANT
BNDG ELASTIC 6X10 VLCR STRL LF (GAUZE/BANDAGES/DRESSINGS) ×1 IMPLANT
BNDG GAUZE DERMACEA FLUFF 4 (GAUZE/BANDAGES/DRESSINGS) ×1 IMPLANT
CANISTER SUCT 1200ML W/VALVE (MISCELLANEOUS) ×1 IMPLANT
CHLORAPREP W/TINT 26 (MISCELLANEOUS) ×1 IMPLANT
COVER BACK TABLE 60X90IN (DRAPES) ×1 IMPLANT
CUFF TRNQT CYL 34X4.125X (TOURNIQUET CUFF) ×1 IMPLANT
DRAPE EXTREMITY T 121X128X90 (DISPOSABLE) ×1 IMPLANT
DRAPE IMP U-DRAPE 54X76 (DRAPES) ×1 IMPLANT
DRAPE OEC MINIVIEW 54X84 (DRAPES) IMPLANT
DRAPE U-SHAPE 47X51 STRL (DRAPES) ×1 IMPLANT
DRSG MEPITEL 4X7.2 (GAUZE/BANDAGES/DRESSINGS) ×1 IMPLANT
ELECTRODE REM PT RTRN 9FT ADLT (ELECTROSURGICAL) ×1 IMPLANT
GAUZE PAD ABD 8X10 STRL (GAUZE/BANDAGES/DRESSINGS) ×5 IMPLANT
GAUZE SPONGE 4X4 12PLY STRL (GAUZE/BANDAGES/DRESSINGS) ×1 IMPLANT
GLOVE BIOGEL PI IND STRL 8 (GLOVE) ×1 IMPLANT
GLOVE SURG SS PI 7.5 STRL IVOR (GLOVE) ×2 IMPLANT
GOWN STRL REUS W/ TWL LRG LVL3 (GOWN DISPOSABLE) ×2 IMPLANT
NDL HYPO 25X1 1.5 SAFETY (NEEDLE) IMPLANT
NDL SUT 6 .5 CRC .975X.05 MAYO (NEEDLE) IMPLANT
NEEDLE HYPO 25X1 1.5 SAFETY (NEEDLE) IMPLANT
PACK BASIN DAY SURGERY FS (CUSTOM PROCEDURE TRAY) ×1 IMPLANT
PAD CAST 4YDX4 CTTN HI CHSV (CAST SUPPLIES) ×1 IMPLANT
PADDING CAST ABS COTTON 4X4 ST (CAST SUPPLIES) IMPLANT
PADDING CAST SYNTHETIC 4X4 STR (CAST SUPPLIES) ×3 IMPLANT
PADDING CAST SYNTHETIC 6X4 NS (CAST SUPPLIES) ×3 IMPLANT
PENCIL SMOKE EVACUATOR (MISCELLANEOUS) ×1 IMPLANT
SANITIZER HAND PURELL FF 515ML (MISCELLANEOUS) ×1 IMPLANT
SHEET MEDIUM DRAPE 40X70 STRL (DRAPES) ×1 IMPLANT
SLEEVE SCD COMPRESS KNEE MED (STOCKING) ×1 IMPLANT
SPLINT FIBERGLASS 4X30 (CAST SUPPLIES) ×1 IMPLANT
SPONGE T-LAP 18X18 ~~LOC~~+RFID (SPONGE) ×1 IMPLANT
STOCKINETTE 6 STRL (DRAPES) IMPLANT
SUCTION TUBE FRAZIER 10FR DISP (SUCTIONS) IMPLANT
SUT ETHILON 2 0 FS 18 (SUTURE) IMPLANT
SUT MNCRL AB 3-0 PS2 18 (SUTURE) IMPLANT
SUT VIC AB 0 CT1 27XBRD ANBCTR (SUTURE) ×1 IMPLANT
SUT VIC AB 1 CT1 27XBRD ANBCTR (SUTURE) IMPLANT
SUT VIC AB 2-0 CT1 TAPERPNT 27 (SUTURE) IMPLANT
SUT VIC AB 3-0 SH 27X BRD (SUTURE) ×1 IMPLANT
SUTURE FIBERWR #2 38 T-5 BLUE (SUTURE) IMPLANT
SUTURE TAPE 1.3 FIBERLOP 20 ST (SUTURE) IMPLANT
SYR BULB EAR ULCER 3OZ GRN STR (SYRINGE) ×1 IMPLANT
SYR CONTROL 10ML LL (SYRINGE) IMPLANT
TOWEL GREEN STERILE FF (TOWEL DISPOSABLE) ×2 IMPLANT
TUBE CONNECTING 20X1/4 (TUBING) IMPLANT
UNDERPAD 30X36 HEAVY ABSORB (UNDERPADS AND DIAPERS) ×1 IMPLANT
YANKAUER SUCT BULB TIP NO VENT (SUCTIONS) IMPLANT

## 2023-12-03 NOTE — Op Note (Addendum)
 12/03/2023  5:03 PM   PATIENT: Suzanne Collins  68 y.o. female  MRN: [id number]   PRE-OPERATIVE DIAGNOSIS:   Rupture of left Achilles tendon, initial encounter   POST-OPERATIVE DIAGNOSIS:   Rupture of left Achilles tendon, initial encounter   PROCEDURE: 1] Left Achilles tendon mid substance open primary repair 2] Left posterior compartment fasciotomy   SURGEON:  Ali Ink, MD   ASSISTANT: None   ANESTHESIA: General, regional   EBL: Minimal   TOURNIQUET:   * Missing tourniquet times found for documented tourniquets in log: [id number] * Total Tourniquet Time Documented: Thigh (Left) - 144 minutes Total: Thigh (Left) - 144 minutes    COMPLICATIONS: None apparent   DISPOSITION: Extubated, awake and stable to recovery.   INDICATION FOR PROCEDURE: The patient presented with above diagnosis.  We discussed the diagnosis, alternative treatment options, risks and benefits of the above surgical intervention, as well as alternative non-operative treatments. All questions/concerns were addressed and the patient/family demonstrated appropriate understanding of the diagnosis, the procedure, the postoperative course, and overall prognosis. The patient wished to proceed with surgical intervention and signed an informed surgical consent as such, in each others presence prior to surgery.   PROCEDURE IN DETAIL: After preoperative consent was obtained and the correct operative site was identified, the patient was brought to the operating room supine on stretcher and transferred onto operating table. General anesthesia was induced. Preoperative antibiotics were administered. Surgical timeout was taken. The patient was then positioned prone. The operative lower extremity was prepped and draped in standard sterile fashion with a tourniquet around the thigh. The extremity was exsanguinated and the tourniquet was inflated to 275 mmHg.  A longitudinal incision was made over the  palpable defect in the Achilles.  Dissection was carried sharply down through the subcutaneous tissues and peritenon creating full-thickness flaps medially and laterally.  The Achilles tendon rupture was identified.  Hematoma was debrided.  The tendon ends were trimmed with scissors.   A posterior compartment fasciotomy was performed longitudinally with excellent release of the flexor hallucis longus tendon sheath.  The tendon ends were roughly approximated.  #1 Vicryl box sutures on CT-1 ineedle were placed in each tendon stump 1 cm, 2 cm and 3 cm from the rupture site.  The ankle was then maximally plantarflexed.  The tendon ends were then sequentially tied from near to far.  The [place] test was noted to be normal at this point.  A 3-0 Monocryl running circumferential stitch was placed around the tendon to augment the repair.  The surgical sites were thoroughly irrigated. The tourniquet was deflated and hemostasis achieved. Betadine  and vancomycin  powder were applied. The deep layers were closed using 2-0 vicryl. The skin was closed without tension.    The leg was cleaned with saline and sterile dressings with gauze were applied. A well padded bulky short leg splint was applied in resting equinus. The patient was awakened from anesthesia and transported to the recovery room in stable condition.    FOLLOW UP PLAN: -transfer to PACU, then home -strict NWB operative extremity, maximum elevation -maintain short leg splint until follow up -DVT ppx: Aspirin 81 mg twice daily while NWB -follow up as outpatient within 7-10 days for wound check with exchange of short leg splint to short leg cast -sutures out in 2-3 weeks in outpatient office   RADIOGRAPHS: None used   Ali Ink Orthopaedic Surgery [facility]

## 2023-12-03 NOTE — Anesthesia Procedure Notes (Signed)
 Anesthesia Regional Block: Popliteal block   Pre-Anesthetic Checklist: , timeout performed,  Correct Patient, Correct Site, Correct Laterality,  Correct Procedure, Correct Position, site marked,  Risks and benefits discussed,  Surgical consent,  Pre-op evaluation,  At surgeon's request and post-op pain management  Laterality: Left  Prep: chloraprep       Needles:  Injection technique: Single-shot  Needle Type: Echogenic Needle     Needle Length: 9cm  Needle Gauge: 21     Additional Needles:   Procedures:,,,, ultrasound used (permanent image in chart),,    Narrative:  Start time: 12/03/2023 2:34 PM End time: 12/03/2023 2:40 PM Injection made incrementally with aspirations every 5 mL.  Performed by: Personally  Anesthesiologist: Erin Havers, MD  Additional Notes: No pain on injection. No increased resistance to injection. Injection made in 5cc increments.  Good needle visualization.  Patient tolerated procedure well.

## 2023-12-03 NOTE — H&P (Signed)
 H&P Update:  -History and Physical Reviewed  -Patient has been re-examined  -No change in the plan of care  -The risks and benefits were presented and reviewed. The risks due to tendon transfer, tendon rerupture, future Achilles reconstruction and/or calcaneus exostectomy, hardware/suture failure and/or irritation, new/persistent infection, stiffness, nerve/vessel/tendon injury or rerupture of repaired tendon, nonunion/malunion, allograft usage, wound healing issues, development of arthritis, failure of this surgery, possibility of external fixation with delayed definitive surgery, need for further surgery, thromboembolic events, anesthesia/medical complications, amputation, death among others were discussed. The patient acknowledged the explanation, agreed to proceed with the plan and a consent was signed.  Ali Ink

## 2023-12-03 NOTE — Progress Notes (Signed)
Assisted Dr. Gifford Shave with left, adductor canal, popliteal, ultrasound guided block. Side rails up, monitors on throughout procedure. See vital signs in flow sheet. Tolerated Procedure well.

## 2023-12-03 NOTE — Anesthesia Procedure Notes (Signed)
 Procedure Name: Intubation Date/Time: 12/03/2023 4:14 PM  Performed by: Erin Havers, MDPre-anesthesia Checklist: Patient identified, Emergency Drugs available, Suction available and Patient being monitored Patient Re-evaluated:Patient Re-evaluated prior to induction Oxygen Delivery Method: Circle system utilized Preoxygenation: Pre-oxygenation with 100% oxygen Induction Type: IV induction Ventilation: Mask ventilation without difficulty Laryngoscope Size: Mac and 3 Grade View: Grade I Tube type: Oral Tube size: 7.0 mm Number of attempts: 1 Airway Equipment and Method: Stylet and Oral airway Placement Confirmation: ETT inserted through vocal cords under direct vision, positive ETCO2 and breath sounds checked- equal and bilateral Secured at: 22 cm Tube secured with: Tape Dental Injury: Teeth and Oropharynx as per pre-operative assessment

## 2023-12-03 NOTE — Anesthesia Preprocedure Evaluation (Addendum)
 Anesthesia Evaluation  Patient identified by MRN, date of birth, ID band Patient awake    Reviewed: Allergy & Precautions, NPO status , Patient's Chart, lab work & pertinent test results  Airway Mallampati: II  TM Distance: >3 FB Neck ROM: Full    Dental  (+) Teeth Intact, Dental Advisory Given   Pulmonary neg pulmonary ROS   Pulmonary exam normal breath sounds clear to auscultation       Cardiovascular negative cardio ROS Normal cardiovascular exam Rhythm:Regular Rate:Normal     Neuro/Psych  PSYCHIATRIC DISORDERS Anxiety     negative neurological ROS     GI/Hepatic negative GI ROS, Neg liver ROS,,,  Endo/Other  negative endocrine ROS    Renal/GU negative Renal ROS     Musculoskeletal  Rupture of left Achilles tendon   Abdominal   Peds  Hematology negative hematology ROS (+)   Anesthesia Other Findings Day of surgery medications reviewed with the patient.  Reproductive/Obstetrics                              Anesthesia Physical Anesthesia Plan  ASA: 2  Anesthesia Plan: General   Post-op Pain Management: Regional block* and Tylenol  PO (pre-op)*   Induction: Intravenous  PONV Risk Score and Plan: 3 and Midazolam, Dexamethasone  and Ondansetron   Airway Management Planned: Oral ETT  Additional Equipment:   Intra-op Plan:   Post-operative Plan: Extubation in OR  Informed Consent: I have reviewed the patients History and Physical, chart, labs and discussed the procedure including the risks, benefits and alternatives for the proposed anesthesia with the patient or authorized representative who has indicated his/her understanding and acceptance.     Dental advisory given  Plan Discussed with: CRNA  Anesthesia Plan Comments:          Anesthesia Quick Evaluation

## 2023-12-03 NOTE — Anesthesia Procedure Notes (Signed)
 Anesthesia Regional Block: Adductor canal block   Pre-Anesthetic Checklist: , timeout performed,  Correct Patient, Correct Site, Correct Laterality,  Correct Procedure, Correct Position, site marked,  Risks and benefits discussed,  Surgical consent,  Pre-op evaluation,  At surgeon's request and post-op pain management  Laterality: Left  Prep: chloraprep       Needles:  Injection technique: Single-shot  Needle Type: Echogenic Needle     Needle Length: 9cm  Needle Gauge: 21     Additional Needles:   Procedures:,,,, ultrasound used (permanent image in chart),,    Narrative:  Start time: 12/03/2023 2:40 PM End time: 12/03/2023 2:45 PM Injection made incrementally with aspirations every 5 mL.  Performed by: Personally  Anesthesiologist: Erin Havers, MD  Additional Notes: No pain on injection. No increased resistance to injection. Injection made in 5cc increments.  Good needle visualization.  Patient tolerated procedure well.

## 2023-12-03 NOTE — Transfer of Care (Signed)
 Immediate Anesthesia Transfer of Care Note  Patient: Suzanne Collins  Procedure(s) Performed: REPAIR, TENDON, ACHILLES (Left)  Patient Location: PACU  Anesthesia Type:GA combined with regional for post-op pain  Level of Consciousness: awake and patient cooperative  Airway & Oxygen Therapy: Patient Spontanous Breathing and Patient connected to face mask oxygen  Post-op Assessment: Report given to RN and Post -op Vital signs reviewed and stable  Post vital signs: Reviewed and stable  Last Vitals:  Vitals Value Taken Time  BP 126/75 12/03/23 1715  Temp 36.2 C 12/03/23 1712  Pulse 80 12/03/23 1723  Resp 12 12/03/23 1723  SpO2 100 % 12/03/23 1723  Vitals shown include unfiled device data.  Last Pain:  Vitals:   12/03/23 1712  TempSrc:   PainSc: Asleep         Complications: No notable events documented.

## 2023-12-03 NOTE — Anesthesia Procedure Notes (Signed)
 Procedure Name: Intubation Date/Time: 12/03/2023 4:11 PM  Performed by: Darcel Early, CRNAPre-anesthesia Checklist: Patient identified, Emergency Drugs available, Suction available and Patient being monitored Patient Re-evaluated:Patient Re-evaluated prior to induction Oxygen Delivery Method: Circle system utilized Preoxygenation: Pre-oxygenation with 100% oxygen Induction Type: IV induction Ventilation: Mask ventilation without difficulty Laryngoscope Size: Mac and 3 Grade View: Grade II Tube type: Oral Tube size: 7.0 mm Number of attempts: 1 Airway Equipment and Method: Stylet Placement Confirmation: ETT inserted through vocal cords under direct vision, positive ETCO2 and breath sounds checked- equal and bilateral Secured at: 22 cm Tube secured with: Tape Dental Injury: Teeth and Oropharynx as per pre-operative assessment

## 2023-12-04 ENCOUNTER — Encounter (HOSPITAL_BASED_OUTPATIENT_CLINIC_OR_DEPARTMENT_OTHER): Payer: Self-pay | Admitting: Orthopaedic Surgery

## 2023-12-04 NOTE — Anesthesia Postprocedure Evaluation (Signed)
 Anesthesia Post Note  Patient: Suzanne Collins  Procedure(s) Performed: REPAIR, TENDON, ACHILLES (Left)     Patient location during evaluation: PACU Anesthesia Type: General and Regional Level of consciousness: awake and alert Pain management: pain level controlled Vital Signs Assessment: post-procedure vital signs reviewed and stable Respiratory status: spontaneous breathing, nonlabored ventilation, respiratory function stable and patient connected to nasal cannula oxygen Cardiovascular status: blood pressure returned to baseline and stable Postop Assessment: no apparent nausea or vomiting Anesthetic complications: no   No notable events documented.  Last Vitals:  Vitals:   12/03/23 1745 12/03/23 1800  BP: (!) 131/92 137/74  Pulse: 91 84  Resp: 15 16  Temp:  (!) 36.2 C  SpO2: 96% 95%    Last Pain:  Vitals:   12/03/23 1800  TempSrc:   PainSc: 0-No pain                 Theotis Flake P Milta Croson

## 2023-12-12 DIAGNOSIS — S86012A Strain of left Achilles tendon, initial encounter: Secondary | ICD-10-CM | POA: Diagnosis not present

## 2023-12-12 DIAGNOSIS — S86012D Strain of left Achilles tendon, subsequent encounter: Secondary | ICD-10-CM | POA: Diagnosis not present

## 2023-12-26 DIAGNOSIS — S86012D Strain of left Achilles tendon, subsequent encounter: Secondary | ICD-10-CM | POA: Diagnosis not present

## 2024-01-24 DIAGNOSIS — S86012A Strain of left Achilles tendon, initial encounter: Secondary | ICD-10-CM | POA: Diagnosis not present

## 2024-01-28 DIAGNOSIS — S86012A Strain of left Achilles tendon, initial encounter: Secondary | ICD-10-CM | POA: Diagnosis not present

## 2024-01-30 DIAGNOSIS — S86012A Strain of left Achilles tendon, initial encounter: Secondary | ICD-10-CM | POA: Diagnosis not present

## 2024-02-02 DIAGNOSIS — S86012A Strain of left Achilles tendon, initial encounter: Secondary | ICD-10-CM | POA: Diagnosis not present

## 2024-02-05 DIAGNOSIS — S86012A Strain of left Achilles tendon, initial encounter: Secondary | ICD-10-CM | POA: Diagnosis not present

## 2024-02-16 DIAGNOSIS — S86012A Strain of left Achilles tendon, initial encounter: Secondary | ICD-10-CM | POA: Diagnosis not present

## 2024-02-19 DIAGNOSIS — S86012A Strain of left Achilles tendon, initial encounter: Secondary | ICD-10-CM | POA: Diagnosis not present

## 2024-02-23 DIAGNOSIS — S86012A Strain of left Achilles tendon, initial encounter: Secondary | ICD-10-CM | POA: Diagnosis not present

## 2024-02-26 DIAGNOSIS — S86012A Strain of left Achilles tendon, initial encounter: Secondary | ICD-10-CM | POA: Diagnosis not present

## 2024-03-01 DIAGNOSIS — S86012A Strain of left Achilles tendon, initial encounter: Secondary | ICD-10-CM | POA: Diagnosis not present

## 2024-03-04 DIAGNOSIS — S86012A Strain of left Achilles tendon, initial encounter: Secondary | ICD-10-CM | POA: Diagnosis not present

## 2024-03-10 DIAGNOSIS — S86012A Strain of left Achilles tendon, initial encounter: Secondary | ICD-10-CM | POA: Diagnosis not present

## 2024-03-15 DIAGNOSIS — S86012A Strain of left Achilles tendon, initial encounter: Secondary | ICD-10-CM | POA: Diagnosis not present

## 2024-03-18 DIAGNOSIS — S86012A Strain of left Achilles tendon, initial encounter: Secondary | ICD-10-CM | POA: Diagnosis not present

## 2024-03-19 DIAGNOSIS — S86012A Strain of left Achilles tendon, initial encounter: Secondary | ICD-10-CM | POA: Diagnosis not present

## 2024-03-29 DIAGNOSIS — S86012A Strain of left Achilles tendon, initial encounter: Secondary | ICD-10-CM | POA: Diagnosis not present

## 2024-04-01 DIAGNOSIS — S86012A Strain of left Achilles tendon, initial encounter: Secondary | ICD-10-CM | POA: Diagnosis not present

## 2024-04-05 DIAGNOSIS — S86012A Strain of left Achilles tendon, initial encounter: Secondary | ICD-10-CM | POA: Diagnosis not present

## 2024-04-09 DIAGNOSIS — S86012A Strain of left Achilles tendon, initial encounter: Secondary | ICD-10-CM | POA: Diagnosis not present

## 2024-04-12 DIAGNOSIS — S86012A Strain of left Achilles tendon, initial encounter: Secondary | ICD-10-CM | POA: Diagnosis not present

## 2024-04-15 DIAGNOSIS — S86012A Strain of left Achilles tendon, initial encounter: Secondary | ICD-10-CM | POA: Diagnosis not present

## 2024-04-19 DIAGNOSIS — S86012A Strain of left Achilles tendon, initial encounter: Secondary | ICD-10-CM | POA: Diagnosis not present

## 2024-04-26 DIAGNOSIS — S86012A Strain of left Achilles tendon, initial encounter: Secondary | ICD-10-CM | POA: Diagnosis not present

## 2024-04-27 DIAGNOSIS — Z8639 Personal history of other endocrine, nutritional and metabolic disease: Secondary | ICD-10-CM | POA: Diagnosis not present

## 2024-04-27 DIAGNOSIS — Z23 Encounter for immunization: Secondary | ICD-10-CM | POA: Diagnosis not present

## 2024-04-27 DIAGNOSIS — Z0000 Encounter for general adult medical examination without abnormal findings: Secondary | ICD-10-CM | POA: Diagnosis not present

## 2024-04-27 DIAGNOSIS — E669 Obesity, unspecified: Secondary | ICD-10-CM | POA: Diagnosis not present

## 2024-04-27 DIAGNOSIS — E782 Mixed hyperlipidemia: Secondary | ICD-10-CM | POA: Diagnosis not present

## 2024-04-27 DIAGNOSIS — I700 Atherosclerosis of aorta: Secondary | ICD-10-CM | POA: Diagnosis not present

## 2024-04-27 DIAGNOSIS — M8580 Other specified disorders of bone density and structure, unspecified site: Secondary | ICD-10-CM | POA: Diagnosis not present

## 2024-05-06 DIAGNOSIS — S86012A Strain of left Achilles tendon, initial encounter: Secondary | ICD-10-CM | POA: Diagnosis not present

## 2024-05-12 DIAGNOSIS — S86012A Strain of left Achilles tendon, initial encounter: Secondary | ICD-10-CM | POA: Diagnosis not present
